# Patient Record
Sex: Male | Born: 1960 | Race: White | Hispanic: No | Marital: Married
Health system: Southern US, Community
[De-identification: ages and names within clinical notes are randomized; demographics above are authoritative.]

## PROBLEM LIST (undated history)

## (undated) DIAGNOSIS — R918 Other nonspecific abnormal finding of lung field: Secondary | ICD-10-CM

## (undated) DIAGNOSIS — J90 Pleural effusion, not elsewhere classified: Secondary | ICD-10-CM

## (undated) DIAGNOSIS — J449 Chronic obstructive pulmonary disease, unspecified: Secondary | ICD-10-CM

## (undated) DIAGNOSIS — J189 Pneumonia, unspecified organism: Secondary | ICD-10-CM

---

## 2003-04-17 ENCOUNTER — Inpatient Hospital Stay (HOSPITAL_COMMUNITY): Admission: AD | Admit: 2003-04-17 | Discharge: 2003-04-20 | Payer: Self-pay | Admitting: Cardiovascular Disease

## 2003-04-18 ENCOUNTER — Encounter (INDEPENDENT_AMBULATORY_CARE_PROVIDER_SITE_OTHER): Payer: Self-pay | Admitting: Cardiology

## 2004-03-18 ENCOUNTER — Encounter: Admission: RE | Admit: 2004-03-18 | Discharge: 2004-03-18 | Payer: Self-pay | Admitting: Cardiovascular Disease

## 2005-04-28 ENCOUNTER — Ambulatory Visit: Payer: Self-pay | Admitting: Pulmonary Disease

## 2005-05-11 ENCOUNTER — Ambulatory Visit: Admission: RE | Admit: 2005-05-11 | Discharge: 2005-05-11 | Payer: Self-pay | Admitting: Pulmonary Disease

## 2005-05-15 ENCOUNTER — Ambulatory Visit: Admission: RE | Admit: 2005-05-15 | Discharge: 2005-05-15 | Payer: Self-pay | Admitting: Pulmonary Disease

## 2005-05-15 ENCOUNTER — Ambulatory Visit: Payer: Self-pay | Admitting: Pulmonary Disease

## 2005-05-29 ENCOUNTER — Ambulatory Visit: Payer: Self-pay | Admitting: Pulmonary Disease

## 2005-06-05 ENCOUNTER — Ambulatory Visit: Payer: Self-pay | Admitting: Pulmonary Disease

## 2005-07-16 ENCOUNTER — Encounter: Admission: RE | Admit: 2005-07-16 | Discharge: 2005-07-16 | Payer: Self-pay | Admitting: Rheumatology

## 2005-07-22 ENCOUNTER — Ambulatory Visit: Admission: RE | Admit: 2005-07-22 | Discharge: 2005-07-22 | Payer: Self-pay | Admitting: Pulmonary Disease

## 2005-07-22 ENCOUNTER — Ambulatory Visit: Payer: Self-pay | Admitting: Pulmonary Disease

## 2005-09-01 ENCOUNTER — Ambulatory Visit: Payer: Self-pay | Admitting: Pulmonary Disease

## 2005-12-01 ENCOUNTER — Ambulatory Visit: Payer: Self-pay | Admitting: Pulmonary Disease

## 2006-04-14 ENCOUNTER — Ambulatory Visit: Payer: Self-pay | Admitting: Pulmonary Disease

## 2006-07-15 ENCOUNTER — Ambulatory Visit: Payer: Self-pay | Admitting: Pulmonary Disease

## 2006-07-17 ENCOUNTER — Ambulatory Visit: Payer: Self-pay | Admitting: Cardiology

## 2006-11-28 DIAGNOSIS — Z8719 Personal history of other diseases of the digestive system: Secondary | ICD-10-CM

## 2006-11-28 DIAGNOSIS — E119 Type 2 diabetes mellitus without complications: Secondary | ICD-10-CM | POA: Insufficient documentation

## 2006-11-28 DIAGNOSIS — I252 Old myocardial infarction: Secondary | ICD-10-CM

## 2006-11-28 DIAGNOSIS — I10 Essential (primary) hypertension: Secondary | ICD-10-CM | POA: Insufficient documentation

## 2006-11-28 DIAGNOSIS — M069 Rheumatoid arthritis, unspecified: Secondary | ICD-10-CM | POA: Insufficient documentation

## 2006-11-28 DIAGNOSIS — I509 Heart failure, unspecified: Secondary | ICD-10-CM | POA: Insufficient documentation

## 2006-11-28 DIAGNOSIS — G4730 Sleep apnea, unspecified: Secondary | ICD-10-CM | POA: Insufficient documentation

## 2006-11-28 DIAGNOSIS — J4489 Other specified chronic obstructive pulmonary disease: Secondary | ICD-10-CM | POA: Insufficient documentation

## 2006-11-28 DIAGNOSIS — G2581 Restless legs syndrome: Secondary | ICD-10-CM | POA: Insufficient documentation

## 2006-11-28 DIAGNOSIS — J449 Chronic obstructive pulmonary disease, unspecified: Secondary | ICD-10-CM

## 2006-11-28 DIAGNOSIS — I2510 Atherosclerotic heart disease of native coronary artery without angina pectoris: Secondary | ICD-10-CM | POA: Insufficient documentation

## 2006-11-28 DIAGNOSIS — E785 Hyperlipidemia, unspecified: Secondary | ICD-10-CM

## 2007-05-05 ENCOUNTER — Ambulatory Visit: Payer: Self-pay | Admitting: Pulmonary Disease

## 2007-05-06 ENCOUNTER — Telehealth: Payer: Self-pay | Admitting: Pulmonary Disease

## 2007-05-18 ENCOUNTER — Ambulatory Visit: Payer: Self-pay | Admitting: Cardiology

## 2007-05-26 ENCOUNTER — Encounter: Payer: Self-pay | Admitting: Cardiology

## 2007-05-26 ENCOUNTER — Ambulatory Visit: Payer: Self-pay | Admitting: Cardiology

## 2007-06-09 ENCOUNTER — Telehealth: Payer: Self-pay | Admitting: Pulmonary Disease

## 2008-05-16 ENCOUNTER — Ambulatory Visit: Payer: Self-pay | Admitting: Cardiology

## 2008-05-16 ENCOUNTER — Encounter: Payer: Self-pay | Admitting: Cardiology

## 2008-05-16 DIAGNOSIS — F17200 Nicotine dependence, unspecified, uncomplicated: Secondary | ICD-10-CM | POA: Insufficient documentation

## 2008-06-05 ENCOUNTER — Telehealth: Payer: Self-pay | Admitting: Cardiology

## 2008-07-05 ENCOUNTER — Encounter: Payer: Self-pay | Admitting: Pulmonary Disease

## 2009-06-04 ENCOUNTER — Ambulatory Visit: Payer: Self-pay | Admitting: Cardiology

## 2010-01-10 ENCOUNTER — Encounter: Payer: Self-pay | Admitting: Pulmonary Disease

## 2010-01-28 ENCOUNTER — Encounter: Payer: Self-pay | Admitting: Pulmonary Disease

## 2010-02-21 ENCOUNTER — Encounter: Payer: Self-pay | Admitting: Pulmonary Disease

## 2010-03-01 ENCOUNTER — Encounter: Payer: Self-pay | Admitting: Pulmonary Disease

## 2010-03-03 ENCOUNTER — Encounter: Payer: Self-pay | Admitting: Pulmonary Disease

## 2010-03-12 NOTE — Assessment & Plan Note (Signed)
Summary: 4-6 month fu-[place] pat lives in [place]   Visit Type:  Follow-up Primary Provider:  Dr. Lorelei Pont in [place], [place]  CC:  CAD.  History of Present Illness: The patient returns for yearly followup. Since I last saw him he has had an unexplained 60 pound weight loss. With that weight loss his sugars are now much better controlled. He otherwise has felt well. He has had no chest pressure, neck or arm discomfort. He has had no shortness of breath, PND or orthopnea. He has had no palpitations, presyncope or syncope.  Preventive Screening-Counseling & Management  Alcohol-Tobacco     Smoking Status: current  Comments: smokes about 3 cigs/day. Started smoking around 50 yrs old  Current Medications (verified): 1)  Methadone Hcl 10 Mg/ml  Conc (Methadone Hcl) .... Take One Pill Three Times Per Day 2)  Percocet 5-325 Mg  Tabs (Oxycodone-Acetaminophen) .... Take One Pill Four Times Per Day As Needed 3)  Coreg 25 Mg  Tabs (Carvedilol) .... Take Half A Pill Twice Daily 4)  Alprazolam 0.5 Mg  Tabs (Alprazolam) .... Take One Pill Twice Daily 5)  K-Lor 20 Meq  Pack (Potassium Chloride) .... Take One Pill Once Daily 6)  Furosemide 40 Mg  Tabs (Furosemide) .... Take One Pill Twice Daily 7)  Bayer Low Strength 81 Mg  Tbec (Aspirin) .... Take One Pill Once Daily 8)  Methotrexate 2.5 Mg  Tabs (Methotrexate Sodium) .... Take Ten Pills Per Week 9)  Qualaquin 324 Mg  Caps (Quinine Sulfate) .... Take Once Daily 10)  Prevacid 30 Mg  Cpdr (Lansoprazole) .... Take One Pill Daily 11)  Albuterol 90 Mcg/act  Aers (Albuterol) .... Take Two Puffs Every Four Hours As Needed 12)  Fibercon 625 Mg  Tabs (Calcium Polycarbophil) .... Take As Needed 13)  Prednisone 5 Mg  Tabs (Prednisone) .... As Needed 14)  Folic Acid 1 Mg  Tabs (Folic Acid) .Marland Kitchen.. 1 By Mouth Daily 15)  Celebrex 200 Mg  Caps (Celecoxib) .Marland Kitchen.. 1 By Mouth Two Times A Day 16)  Parafon Forte Dsc 500 Mg  Tabs (Chlorzoxazone) .Marland Kitchen.. 1 By Mouth Three  Times A Day 17)  Spiriva Handihaler 18 Mcg  Caps (Tiotropium Bromide Monohydrate) .Marland Kitchen.. 1 Daily 18)  Nitroquick 0.4 Mg  Subl (Nitroglycerin) .... As Needed 19)  Flonase 50 Mcg/act  Susp (Fluticasone Propionate) .... 2 Sprays Each Nostril Once Daily As Needed 20)  Altace 2.5 Mg Tabs (Ramipril) .... Take 1 Tablet By Mouth Two Times A Day 21)  Ambien 10 Mg Tabs (Zolpidem Tartrate) .... One By Mouth Daily 22)  Vytorin 10-40 Mg Tabs (Ezetimibe-Simvastatin) .... One By Mouth Daily 23)  Metformin Hcl 500 Mg Tabs (Metformin Hcl) .... Take 1 Tablet By Mouth Once A Day As Needed 24)  Claritin 10 Mg Tabs (Loratadine) .... Take 1 Tablet By Mouth Once A Day As Needed 25)  Stool Softener 100 Mg Caps (Docusate Sodium) .... As Needed  Allergies: No Known Drug Allergies  Comments:  Nurse/Medical Assistant: The patient's medications were reviewed with the patient and were updated in the Medication List. Pt brought a list of medications to office visit.  Cyril Loosen, RN, BSN (June 04, 2009 10:50 AM)  Past History:  Past Medical History: Coronary artery disease (anterior myocardial   infarction with stenting to his LAD with a cypher stent, I do not have the details of his   most recent catheterization which apparently in 2006), mildly reduced   ejection fraction (echo in February 2007 in  [place] with an EF of   50%.), hypertension  since 1999, hyperlipidemia since 1999, rheumatoid arthritis, diabetes   mellitus since 2002, restless leg syndrome, sleep apnea on CPAP  Past Surgical History: Reviewed history from 05/11/2008 and no changes required. Back surgery, hemorrhoids.   Review of Systems       As stated in the HPI and negative for all other systems.   Vital Signs:  Patient profile:   50 year old male Height:      66 inches Weight:      241.75 pounds BMI:     39.16 Pulse rate:   62 / minute BP sitting:   104 / 67  (left arm) Cuff size:   regular  Vitals Entered By: Cyril Loosen, RN, BSN (June 04, 2009 10:48 AM)  Nutrition Counseling: Patient's BMI is greater than 25 and therefore counseled on weight management options. CC: CAD Comments follow up visit   Physical Exam  General:  Well developed, well nourished, in no acute distress. Head:  normocephalic and atraumatic Eyes:  PERRLA/EOM intact; conjunctiva and lids normal. Mouth:  Teeth, gums and palate normal. Oral mucosa normal. Neck:  Neck supple, no JVD. No masses, thyromegaly or abnormal cervical nodes. Heart:  Non-displaced PMI, chest non-tender; regular rate and rhythm, S1, S2 soft systolic murmur heard only at the apex and nonradiating,  no rubs or gallops. Carotid upstroke normal, no bruit. Normal abdominal aortic size, no bruits. Femorals normal pulses, no bruits. Pedals normal pulses. trace edema, no varicosities. Abdomen:  Bowel sounds positive; abdomen soft and non-tender without masses, organomegaly, or hernias noted. No hepatosplenomegaly, obese Msk:  Back normal, normal gait. Muscle strength and tone normal. Extremities:  No clubbing or cyanosis. Neurologic:  Alert and oriented x 3. Psych:  Normal affect.   EKG  Procedure date:  06/04/2009  Findings:      sinus rhythm, rate 63, axis within normal limits, intervals within normal limits, old anteroseptal infarct, no acute ST-T wave changes  Impression & Recommendations:  Problem # 1:  CAD (ICD-414.00) He has no ongoing symptoms. No further cardiovascular testing is suggested. He will continue with risk reduction. Orders: EKG w/ Interpretation (93000)  Problem # 2:  HYPERTENSION (ICD-401.9) His blood pressure is well controlled. He will continue the meds as listed.  Problem # 3:  TOBACCO ABUSE (ICD-305.1) He understands the need to stop smoking. He has been counseled about this. Hopefully he will be able to do this in the future.  Patient Instructions: 1)  Your physician recommends that you continue on your current medications as  directed. Please refer to the Current Medication list given to you today. 2)  Your physician wants you to follow-up in: 1year. You will receive a reminder letter in the mail about two months in advance. If you don't receive a letter, please call our office to schedule the follow-up appointment.

## 2010-03-12 NOTE — Medication Information (Signed)
Summary: RX Folder/ MEDICATION LIST  RX Folder/ MEDICATION LIST   Imported By: Dorise Hiss 06/05/2009 11:26:59  _____________________________________________________________________  External Attachment:    Type:   Image     Comment:   External Document

## 2010-03-13 ENCOUNTER — Other Ambulatory Visit: Payer: Self-pay | Admitting: Pulmonary Disease

## 2010-03-13 ENCOUNTER — Encounter: Payer: Self-pay | Admitting: Pulmonary Disease

## 2010-03-13 ENCOUNTER — Ambulatory Visit (INDEPENDENT_AMBULATORY_CARE_PROVIDER_SITE_OTHER): Payer: Managed Care, Other (non HMO) | Admitting: Pulmonary Disease

## 2010-03-13 DIAGNOSIS — J4489 Other specified chronic obstructive pulmonary disease: Secondary | ICD-10-CM

## 2010-03-13 DIAGNOSIS — M069 Rheumatoid arthritis, unspecified: Secondary | ICD-10-CM

## 2010-03-13 DIAGNOSIS — J90 Pleural effusion, not elsewhere classified: Secondary | ICD-10-CM | POA: Insufficient documentation

## 2010-03-13 DIAGNOSIS — F17200 Nicotine dependence, unspecified, uncomplicated: Secondary | ICD-10-CM

## 2010-03-13 DIAGNOSIS — R918 Other nonspecific abnormal finding of lung field: Secondary | ICD-10-CM

## 2010-03-13 DIAGNOSIS — J449 Chronic obstructive pulmonary disease, unspecified: Secondary | ICD-10-CM

## 2010-03-13 DIAGNOSIS — R222 Localized swelling, mass and lump, trunk: Secondary | ICD-10-CM

## 2010-03-13 DIAGNOSIS — J9610 Chronic respiratory failure, unspecified whether with hypoxia or hypercapnia: Secondary | ICD-10-CM | POA: Insufficient documentation

## 2010-03-14 ENCOUNTER — Telehealth (INDEPENDENT_AMBULATORY_CARE_PROVIDER_SITE_OTHER): Payer: Self-pay | Admitting: *Deleted

## 2010-03-20 NOTE — Progress Notes (Signed)
  Phone Note Other Incoming   Request: Send information Summary of Call: Records received from [facility] [place]. 33 pages forwarded to Dr. Craige Cotta for review.

## 2010-03-20 NOTE — Assessment & Plan Note (Addendum)
Summary: OV CT SHOWS LUNG MASS//SH   Vital Signs:  Patient profile:   50 year old male Height:      66 inches Weight:      228.13 pounds BMI:     36.95 O2 Sat:      90 % on 3 L/min Temp:     97.6 degrees F oral Pulse rate:   71 / minute BP sitting:   92 / 54  (left arm) Cuff size:   large  Vitals Entered By: Carver Fila (March 13, 2010 4:00 PM)  O2 Flow:  3 L/min CC: pt states he is here bc his ct showed a mass. Pt states his breathing is not doing well, having some right sided pain and into the ribs, dry cough. Pt states he smokes 3-4 cigs a day. Pt states [facility] is telling him he has lung cancer and is spreading to his lymph nodes.  Comments meds and allergies updated Phone number updated Carver Fila  March 13, 2010 4:01 PM    Copy to:  Rollene Rotunda, Lorelei Pont Primary Provider/Referring Provider:  Dr. Lorelei Pont in [place], [place]  CC:  pt states he is here bc his ct showed a mass. Pt states his breathing is not doing well, having some right sided pain and into the ribs, and dry cough. Pt states he smokes 3-4 cigs a day. Pt states [facility] is telling him he has lung cancer and is spreading to his lymph nodes. .  History of Present Illness: 50 yo male with lung mass.  I last saw [name] in 2009.  At that time I was seeing him for severe COPD, chronic hypoxemic respiratory failure, and sleep apnea.  Since that time he has lost about 100 lbs.  He developed worsening chest pain and shortness of breath in December 2011.  He was found to have fluid in his lungs, and concern was for pneumonia.  He was treated with antibiotics, but did not improve.  He was hospitalized in [place].  He had right thoracentesis done, but he was told results were inconclusive.  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  He still gets winded easily.  He has occasional cough.  He denies  sputum, hemoptysis, or fever.  He gets occasional wheezing.  He uses albuterol 2 to 3 times per day.  He continues to smoke 3 to 4 cigarettes per day.  He uses 3 liters oxygen 24/7.  He was not able to tolerate CPAP or BPAP.   CT chest from 03/01/10: b/l effusions Rt > Lt with RLL consildation, spiculated mass in LUL measuring 3cm with multiple smaller nodules b/l, and massive mediastinal adenopathy.  Current Medications (verified): 1)  Methadone Hcl 10 Mg/ml  Conc (Methadone Hcl) .... Take One Pill Four Times A Day 2)  Oxycodone Hcl 20 Mg Tabs (Oxycodone Hcl) .... 4 Tablets A Day 3)  Coreg 25 Mg  Tabs (Carvedilol) .Marland Kitchen.. 1 Two Times A Day 4)  Alprazolam 0.5 Mg  Tabs (Alprazolam) .... Take One Pill Twice Daily 5)  K-Lor 20 Meq  Pack (Potassium Chloride) .... Take One Pill Once Daily 6)  Furosemide 40 Mg  Tabs (Furosemide) .... Take One Pill Twice Daily 7)  Bayer Low Strength 81 Mg  Tbec (Aspirin) .... Take One Pill Once Daily 8)  Methotrexate 2.5 Mg  Tabs (Methotrexate Sodium) .... Take Ten Pills Per Week 9)  Qualaquin 324 Mg  Caps (Quinine Sulfate) .... Takes 300 Mg A Day. Orders From [place] 10)  Prevacid 30 Mg  Cpdr (Lansoprazole) .... Take One Pill Daily 11)  Albuterol 90 Mcg/act  Aers (Albuterol) .... Take Two Puffs Every Four Hours As Needed 12)  Fibercon 625 Mg  Tabs (Calcium Polycarbophil) .... Take As Needed 13)  Prednisone 5 Mg  Tabs (Prednisone) .... As Needed 14)  Folic Acid 1 Mg  Tabs (Folic Acid) .Marland Kitchen.. 1 By Mouth Daily 15)  Celebrex 200 Mg  Caps (Celecoxib) .Marland Kitchen.. 1 By Mouth Two Times A Day 16)  Parafon Forte Dsc 500 Mg  Tabs (Chlorzoxazone) .Marland Kitchen.. 1 By Mouth Three Times A Day 17)  Spiriva Handihaler 18 Mcg  Caps (Tiotropium Bromide Monohydrate) .Marland Kitchen.. 1 Daily 18)  Nitroquick 0.4 Mg  Subl (Nitroglycerin) .... As Needed 19)  Flonase 50 Mcg/act  Susp (Fluticasone Propionate) .... 2 Sprays Each Nostril Once Daily As Needed 20)  Altace 2.5 Mg Tabs (Ramipril) .... Take 1 Tablet By Mouth Two  Times A Day 21)  Ambien 10 Mg Tabs (Zolpidem Tartrate) .... One By Mouth Daily 22)  Vytorin 10-40 Mg Tabs (Ezetimibe-Simvastatin) .... One By Mouth Daily 23)  Metformin Hcl 500 Mg Tabs (Metformin Hcl) .... Take 1 Tablet By Mouth Once A Day As Needed 24)  Claritin 10 Mg Tabs (Loratadine) .... Take 1 Tablet By Mouth Once A Day As Needed 25)  Stool Softener 100 Mg Caps (Docusate Sodium) .... As Needed 26)  Benzonatate 100 Mg Caps (Benzonatate) .... 3 Times A Day As Needed 27)  Voltaren 1 % Gel (Diclofenac Sodium) .... Apply 2-4 Times A Day  Allergies (verified): No Known Drug Allergies  Past History:  Past Medical History: Coronary artery disease (anterior myocardial infarction with stenting to his LAD with a cypher stent, his most recent catheterization which apparently in 2006), mildly reduced ejection fraction (echo in February 2007 in [place] with an EF of 50%.) Hypertension  since 1999 Hyperlipidemia since 1999 Rheumatoid arthritis Diabetes mellitus since 2002 Restless leg syndrome Obstructive sleep apnea, intolerant of CPAP Severe COPD Chronic hypoxemic respiratory failure      - 3 liters oxygen 24/7  Family History: Reviewed history from 05/11/2008 and no changes required. Father - MI age 29  Social History: Reviewed history from 05/11/2008 and no changes required. The patient is disabled.  He is married.  He has 3 children.  He has smoked for 30 years, and currently smokes 3 to 4 cigarettes per day.     Physical Exam  General:  decreased weight since last visit.  using supplemental oxygen Eyes:  PERRLA and EOMI, wears glasses Nose:  clear nasal discharge.   Mouth:  Teeth, gums and palate normal. Oral mucosa normal. Neck:  no JVD.   Lungs:  diminished breath sounds, no wheeze or rales Heart:  regular rate and rhythm, S1, S2 without murmurs, rubs, gallops, or clicks Abdomen:  soft, nontender, no masses, normal bowel sounds Extremities:  trace leg  edema Neurologic:  normal CN II-XII and strength normal.   Cervical Nodes:  no significant adenopathy Axillary Nodes:  no significant adenopathy Psych:  anxious.     Impression & Recommendations:  Problem # 1:  MASS, LUNG (ICD-786.6) Radiographic findings as detailed above.  Main concern is that he has primary lung malignancy.  [facility]  Will arrange for PET scan.  Based on these will determine best approach for tissue sampling.  Explained to him that he  would not be a surgical candidate, and that likely options would be chemotherapy +/- radiation therapy.  Explained to him that if he has lung cancer, then it is likely advanced stage.  Problem # 2:  COPD (ICD-496) Continue spiriva and as needed albuterol.  Problem # 3:  EFFUSION, PLEURAL (ICD-511.9) Will get records from [facility] about fluid analysis.  Problem # 4:  TOBACCO ABUSE (ICD-305.1) Encouraged him to remain tobacco free.  Problem # 5:  SLEEP APNEA (ICD-780.57) He was not able to tolerate CPAP/BPAP.    Problem # 6:  CHRONIC RESPIRATORY FAILURE (ICD-518.83) He is to continue supplemental oxygen.  Problem # 7:  ARTHRITIS, RHEUMATOID (ICD-714.0) He is on MTX, and prednisone.  Medications Added to Medication List This Visit: 1)  Coreg 25 Mg Tabs (Carvedilol) .Marland Kitchen.. 1 two times a day 2)  Methadone Hcl 10 Mg/ml Conc (Methadone hcl) .... Take one pill four times a day 3)  Oxycodone Hcl 20 Mg Tabs (Oxycodone hcl) .... 4 tablets a day 4)  Qualaquin 324 Mg Caps (Quinine sulfate) .... Takes 300 mg a day. orders from [place] 5)  Benzonatate 100 Mg Caps (Benzonatate) .... 3 times a day as needed 6)  Voltaren 1 % Gel (Diclofenac sodium) .... Apply 2-4 times a day  Complete Medication List: 1)  Vytorin 10-40 Mg Tabs (Ezetimibe-simvastatin) .... One by mouth daily 2)  Metformin Hcl 500 Mg Tabs (Metformin hcl) .... Take 1 tablet by mouth once a day as needed 3)  Altace 2.5 Mg Tabs  (Ramipril) .... Take 1 tablet by mouth two times a day 4)  Coreg 25 Mg Tabs (Carvedilol) .Marland Kitchen.. 1 two times a day 5)  Furosemide 40 Mg Tabs (Furosemide) .... Take one pill twice daily 6)  K-lor 20 Meq Pack (Potassium chloride) .... Take one pill once daily 7)  Bayer Low Strength 81 Mg Tbec (Aspirin) .... Take one pill once daily 8)  Alprazolam 0.5 Mg Tabs (Alprazolam) .... Take one pill twice daily 9)  Ambien 10 Mg Tabs (Zolpidem tartrate) .... One by mouth daily 10)  Methadone Hcl 10 Mg/ml Conc (Methadone hcl) .... Take one pill four times a day 11)  Oxycodone Hcl 20 Mg Tabs (Oxycodone hcl) .... 4 tablets a day 12)  Celebrex 200 Mg Caps (Celecoxib) .Marland Kitchen.. 1 by mouth two times a day 13)  Methotrexate 2.5 Mg Tabs (Methotrexate sodium) .... Take ten pills per week 14)  Folic Acid 1 Mg Tabs (Folic acid) .Marland Kitchen.. 1 by mouth daily 15)  Prednisone 5 Mg Tabs (Prednisone) .... As needed 16)  Qualaquin 324 Mg Caps (Quinine sulfate) .... Takes 300 mg a day. orders from [place] 17)  Prevacid 30 Mg Cpdr (Lansoprazole) .... Take one pill daily 18)  Spiriva Handihaler 18 Mcg Caps (Tiotropium bromide monohydrate) .Marland Kitchen.. 1 daily 19)  Albuterol 90 Mcg/act Aers (Albuterol) .... Take two puffs every four hours as needed 20)  Flonase 50 Mcg/act Susp (Fluticasone propionate) .... 2 sprays each nostril once daily as needed 21)  Claritin 10 Mg Tabs (Loratadine) .... Take 1 tablet by mouth once a day as needed 22)  Fibercon 625 Mg Tabs (Calcium polycarbophil) .... Take as needed 23)  Parafon Forte Dsc 500 Mg Tabs (Chlorzoxazone) .Marland Kitchen.. 1 by mouth three times a day 24)  Nitroquick 0.4 Mg Subl (Nitroglycerin) .... As needed 25)  Stool Softener 100 Mg Caps (Docusate sodium) .... As needed 26)  Benzonatate 100 Mg Caps (Benzonatate) .... 3 times a day as needed 27)  Voltaren 1 %  Gel (Diclofenac sodium) .... Apply 2-4 times a day  Other Orders: Consultation Level IV (16109) Radiology Referral (Radiology)  Patient  Instructions: 1)  Will arrange for PET scan 2)  Follow up in 2 weeks   Orders Added: 1)  Consultation Level IV [60454] 2)  Radiology Referral [Radiology]   Immunization History:  Influenza Immunization History:    Influenza:  historical (11/10/2009)  Pneumovax Immunization History:    Pneumovax:  historical (11/10/2008)   Immunization History:  Influenza Immunization History:    Influenza:  Historical (11/10/2009)  Pneumovax Immunization History:    Pneumovax:  Historical (11/10/2008)

## 2010-03-21 ENCOUNTER — Encounter (HOSPITAL_COMMUNITY)
Admission: RE | Admit: 2010-03-21 | Discharge: 2010-03-21 | Disposition: A | Discharge status: Home / Self Care | Payer: Managed Care, Other (non HMO) | Source: Ambulatory Visit | Attending: Pulmonary Disease | Admitting: Pulmonary Disease

## 2010-03-21 ENCOUNTER — Encounter (HOSPITAL_COMMUNITY): Payer: Self-pay

## 2010-03-21 DIAGNOSIS — R222 Localized swelling, mass and lump, trunk: Secondary | ICD-10-CM | POA: Insufficient documentation

## 2010-03-21 DIAGNOSIS — R599 Enlarged lymph nodes, unspecified: Secondary | ICD-10-CM | POA: Insufficient documentation

## 2010-03-21 DIAGNOSIS — R918 Other nonspecific abnormal finding of lung field: Secondary | ICD-10-CM

## 2010-03-21 DIAGNOSIS — C3490 Malignant neoplasm of unspecified part of unspecified bronchus or lung: Secondary | ICD-10-CM | POA: Insufficient documentation

## 2010-03-21 HISTORY — DX: Pleural effusion, not elsewhere classified: J90

## 2010-03-21 HISTORY — DX: Chronic obstructive pulmonary disease, unspecified: J44.9

## 2010-03-21 HISTORY — DX: Pneumonia, unspecified organism: J18.9

## 2010-03-21 HISTORY — DX: Other nonspecific abnormal finding of lung field: R91.8

## 2010-03-21 MED ORDER — FLUDEOXYGLUCOSE F - 18 (FDG) INJECTION: 15.5000 | Freq: Once | INTRAVENOUS | Status: AC | PRN

## 2010-03-22 ENCOUNTER — Other Ambulatory Visit: Payer: Self-pay | Admitting: Pulmonary Disease

## 2010-03-22 ENCOUNTER — Telehealth (INDEPENDENT_AMBULATORY_CARE_PROVIDER_SITE_OTHER): Payer: Self-pay | Admitting: *Deleted

## 2010-03-22 DIAGNOSIS — C3490 Malignant neoplasm of unspecified part of unspecified bronchus or lung: Secondary | ICD-10-CM

## 2010-03-26 ENCOUNTER — Telehealth (INDEPENDENT_AMBULATORY_CARE_PROVIDER_SITE_OTHER): Payer: Self-pay | Admitting: *Deleted

## 2010-03-26 ENCOUNTER — Other Ambulatory Visit: Payer: Self-pay | Admitting: Pulmonary Disease

## 2010-03-26 ENCOUNTER — Telehealth: Payer: Self-pay | Admitting: Pulmonary Disease

## 2010-03-26 DIAGNOSIS — R918 Other nonspecific abnormal finding of lung field: Secondary | ICD-10-CM

## 2010-03-27 ENCOUNTER — Other Ambulatory Visit (HOSPITAL_COMMUNITY): Payer: Managed Care, Other (non HMO)

## 2010-03-27 ENCOUNTER — Other Ambulatory Visit: Payer: Self-pay | Admitting: Interventional Radiology

## 2010-03-27 ENCOUNTER — Ambulatory Visit: Payer: Managed Care, Other (non HMO) | Admitting: Pulmonary Disease

## 2010-03-27 ENCOUNTER — Other Ambulatory Visit (HOSPITAL_COMMUNITY): Payer: Medicare Other

## 2010-03-27 ENCOUNTER — Ambulatory Visit (HOSPITAL_COMMUNITY)
Admission: RE | Admit: 2010-03-27 | Discharge: 2010-03-27 | Disposition: A | Discharge status: Home / Self Care | Payer: Managed Care, Other (non HMO) | Source: Ambulatory Visit | Attending: Pulmonary Disease | Admitting: Pulmonary Disease

## 2010-03-27 DIAGNOSIS — R599 Enlarged lymph nodes, unspecified: Secondary | ICD-10-CM | POA: Insufficient documentation

## 2010-03-27 DIAGNOSIS — R918 Other nonspecific abnormal finding of lung field: Secondary | ICD-10-CM

## 2010-03-27 DIAGNOSIS — C7951 Secondary malignant neoplasm of bone: Secondary | ICD-10-CM | POA: Insufficient documentation

## 2010-03-27 DIAGNOSIS — C3490 Malignant neoplasm of unspecified part of unspecified bronchus or lung: Secondary | ICD-10-CM | POA: Insufficient documentation

## 2010-03-27 LAB — CBC
HCT: 35.6 % — ABNORMAL LOW (ref 39.0–52.0)
MCHC: 31.7 g/dL (ref 30.0–36.0)
MCV: 82 fL (ref 78.0–100.0)
RDW: 16.9 % — ABNORMAL HIGH (ref 11.5–15.5)

## 2010-03-27 LAB — GLUCOSE, CAPILLARY: Glucose-Capillary: 146 mg/dL — ABNORMAL HIGH (ref 70–99)

## 2010-03-27 LAB — PROTIME-INR: INR: 1.02 (ref 0.00–1.49)

## 2010-03-28 ENCOUNTER — Telehealth: Payer: Self-pay | Admitting: Pulmonary Disease

## 2010-03-28 DIAGNOSIS — C3490 Malignant neoplasm of unspecified part of unspecified bronchus or lung: Secondary | ICD-10-CM | POA: Insufficient documentation

## 2010-03-28 NOTE — Progress Notes (Signed)
Summary: Biopsy<<<FNA sch for 1/15  Phone Note Other Incoming Call back at [phone]   Caller: Victorino Dike at radiology scheduling Summary of Call: Victorino Dike wants to talk to either Crystal or Lawson Fiscal regarding scheduling patient's fine needle biopsy on 03/27/10. Initial call taken by: Leonette Monarch,  March 22, 2010 2:07 PM  Follow-up for Phone Call        FNA sch for 2/15 and per [facility] in [place], pt is aware and date works fine for him. Will forward to [place] so she can document this on the order. Follow-up by: Michel Bickers CMA,  March 22, 2010 3:02 PM  Additional Follow-up for Phone Call Additional follow up Details #1::        documented in order that FNA scheduled 03/27/10 Additional Follow-up by: Oneita Jolly,  March 22, 2010 3:27 PM

## 2010-04-01 ENCOUNTER — Telehealth (INDEPENDENT_AMBULATORY_CARE_PROVIDER_SITE_OTHER): Payer: Self-pay | Admitting: *Deleted

## 2010-04-03 NOTE — Letter (Signed)
Summary: [facility] [address]  [facility]   Imported By: Lester [place] 03/28/2010 09:58:49  _____________________________________________________________________  External Attachment:    Type:   Image     Comment:   External Document

## 2010-04-03 NOTE — Op Note (Signed)
Summary: R pleural effusion/[facility]  R pleural effusion/[facility]   Imported By: Lester [place] 03/28/2010 09:55:48  _____________________________________________________________________  External Attachment:    Type:   Image     Comment:   External Document

## 2010-04-03 NOTE — Progress Notes (Signed)
Summary: procedures pending tomorrow- pt called x 2  Phone Note Call from Patient Call back at [facility] Phone [phone]   Caller: Patient Call For: sood Summary of Call: pt says he got a call re: biopsy that's scheduled for tomorrow at [facility]. pt says "they also want to draw fluid from his lungs". pt says this was already done at [facility]. says the notes were sent to dr Merton Border sleeper and they were to fax info to dr Smiley Houseman. dr sleeper's office # is [phone].  contact # is [phone]. pt wants call back asap to see "where/ when" he needs to have things done (and what procedures are to be done).  Initial call taken by: Tivis Ringer, CNA,  March 26, 2010 10:21 AM  Follow-up for Phone Call        pt called back requests to speak to dr Craige Cotta or nurse asap this am. Tivis Ringer, CNA  March 26, 2010 11:31 AM  Spoke with pt .  Dr Craige Cotta has spoken with pt and he is going to call him back. Abigail Miyamoto RN  March 26, 2010 11:56 AM

## 2010-04-03 NOTE — Progress Notes (Signed)
Summary: biopsy order  Phone Note From Other Clinic   Caller: Victorino Dike from [facility] Call For: Dr. Craige Cotta Summary of Call: [phone] Victorino Dike from [facility] scheduling called and stated that the radiologist here has reviewed order for biopsy and has recommended doing a thoracentesis. When [place] contacted pt he stated "that he has already had this done at another hospital and that you were aware of this and should have the report from [facility]".  Pt has been scheduled for a thoracentesis tomorrow 03/27/10 at 2:00 [facility].  Pt has stated that he is not going to have a thoracentesis he wants to have the biopsy. Please advise. Thanks, Initial call taken by: Alfonso Ramus,  March 26, 2010 10:23 AM  Follow-up for Phone Call        Plan d/w pt and IR.  He is to have bx likely from Rt chest wall lesion today at 1pm. Follow-up by: Coralyn Helling MD,  March 27, 2010 10:54 AM

## 2010-04-03 NOTE — Progress Notes (Signed)
Summary: Call [name] with results  ---- Converted from flag ---- ---- 03/28/2010 4:03 PM, Coralyn Helling MD wrote: Please schedule ROV to review biopsy results.  Okay to over book for 02/17.  Please check if [name] would prefer I call him with results instead since he has a long distance drive to come here.  thanks. ------------------------------  Phone Note Outgoing Call   Call placed by: Renold Genta RCP, LPN,  March 28, 2010 4:24 PM Summary of Call: Spoke with [name] and he is ok for Dr. Craige Cotta to call him with results , he does have an appt on Feb 29th but doesnt want to wait. Renold Genta RCP, LPN  March 28, 2010 4:25 PM   Follow-up for Phone Call        Spoke with patient over the phone.  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  He would like to have evaluation with oncologist in [place] first to review his options and then decide what he would like to do.  Will also reschedule his ROV for 2 to 3 months Follow-up by: Coralyn Helling MD,  March 28, 2010 4:51 PM  New Problems: ADENOCARCIMONA, LUNG, NONSMALL CELL (ICD-162.9)   New Problems: ADENOCARCIMONA, LUNG, NONSMALL CELL (ICD-162.9)  Appended Document: Call [name] with results Discussed results and plan for oncology referral in [place] with Dr. Leary Roca.

## 2010-04-04 ENCOUNTER — Telehealth (INDEPENDENT_AMBULATORY_CARE_PROVIDER_SITE_OTHER): Payer: Self-pay | Admitting: *Deleted

## 2010-04-05 ENCOUNTER — Ambulatory Visit: Payer: Managed Care, Other (non HMO) | Admitting: Pulmonary Disease

## 2010-04-08 ENCOUNTER — Ambulatory Visit
Admission: RE | Admit: 2010-04-08 | Discharge: 2010-04-08 | Disposition: A | Discharge status: Home / Self Care | Payer: Managed Care, Other (non HMO) | Source: Ambulatory Visit | Attending: Internal Medicine | Admitting: Internal Medicine

## 2010-04-08 ENCOUNTER — Other Ambulatory Visit: Payer: Self-pay | Admitting: Internal Medicine

## 2010-04-08 ENCOUNTER — Encounter (HOSPITAL_BASED_OUTPATIENT_CLINIC_OR_DEPARTMENT_OTHER): Payer: Managed Care, Other (non HMO) | Admitting: Internal Medicine

## 2010-04-08 ENCOUNTER — Encounter: Payer: Managed Care, Other (non HMO) | Admitting: Internal Medicine

## 2010-04-08 DIAGNOSIS — Z85118 Personal history of other malignant neoplasm of bronchus and lung: Secondary | ICD-10-CM

## 2010-04-08 DIAGNOSIS — C3490 Malignant neoplasm of unspecified part of unspecified bronchus or lung: Secondary | ICD-10-CM

## 2010-04-08 DIAGNOSIS — R918 Other nonspecific abnormal finding of lung field: Secondary | ICD-10-CM

## 2010-04-08 LAB — CBC WITH DIFFERENTIAL/PLATELET
Basophils Absolute: 0 10*3/uL (ref 0.0–0.1)
Eosinophils Absolute: 0.2 10*3/uL (ref 0.0–0.5)
HCT: 37.6 % — ABNORMAL LOW (ref 38.4–49.9)
HGB: 11.8 g/dL — ABNORMAL LOW (ref 13.0–17.1)
MONO#: 0.8 10*3/uL (ref 0.1–0.9)
NEUT%: 85.8 % — ABNORMAL HIGH (ref 39.0–75.0)
WBC: 11.2 10*3/uL — ABNORMAL HIGH (ref 4.0–10.3)
lymph#: 0.6 10*3/uL — ABNORMAL LOW (ref 0.9–3.3)

## 2010-04-08 LAB — COMPREHENSIVE METABOLIC PANEL
BUN: 10 mg/dL (ref 6–23)
CO2: 31 mEq/L (ref 19–32)
Calcium: 9.2 mg/dL (ref 8.4–10.5)
Chloride: 96 mEq/L (ref 96–112)
Creatinine, Ser: 0.53 mg/dL (ref 0.40–1.50)

## 2010-04-09 ENCOUNTER — Encounter: Payer: Self-pay | Admitting: Pulmonary Disease

## 2010-04-09 ENCOUNTER — Encounter (INDEPENDENT_AMBULATORY_CARE_PROVIDER_SITE_OTHER): Payer: Managed Care, Other (non HMO) | Admitting: Thoracic Surgery

## 2010-04-09 DIAGNOSIS — C3430 Malignant neoplasm of lower lobe, unspecified bronchus or lung: Secondary | ICD-10-CM

## 2010-04-09 NOTE — Progress Notes (Signed)
Summary: stiil has not heard from cancer center  Phone Note Call from Patient   Caller: Patient Call For: sood Summary of Call: patient phoned stated that he still has not heard from the cancer center and he stated that if they can not see him he would like to be referred to someone that can. He stated that he has been informed that he has possible stage 4 lung cancer and he would like to see someone. he can be reached at [phone] Initial call taken by: Vedia Coffer,  April 04, 2010 10:24 AM  Follow-up for Phone Call        pt called by [email or web address]  today and informed of appt 04/08/10@1 :30pm with dr Gwenyth Bouillon  Follow-up by: Oneita Jolly,  April 04, 2010 12:04 PM

## 2010-04-09 NOTE — Progress Notes (Signed)
Summary: referral to oncologist  Phone Note Call from Patient Call back at Home Phone [phone]   Caller: Patient Call For: SOOD Summary of Call: patient phoned stated that he spoke to Dr Craige Cotta Thursday afternoon and Dr Craige Cotta told him that we were going to refer him to an oncolongist and he has not heard back yet and wanted to check on the referral. he can be reached at [phone]. Initial call taken by: Vedia Coffer,  April 01, 2010 2:50 PM  Follow-up for Phone Call        VS sent order to [facility] last Thursday 03-28-2010 for oncology referral.  pt states he still hasn't been set up for an appt with Oncology.  Will forward message to [facility] to address.  Aundra Millet Reynolds LPN  April 01, 2010 5:00 PM   Additional Follow-up for Phone Call Additional follow up Details #1::        spoke to [email or web address]  and she states dana is still working on this appt i will email danna and the pt is aware of this  Additional Follow-up by: Oneita Jolly,  April 02, 2010 2:36 PM

## 2010-04-10 ENCOUNTER — Ambulatory Visit: Payer: Managed Care, Other (non HMO) | Admitting: Pulmonary Disease

## 2010-04-10 NOTE — Letter (Signed)
April 09, 2010  Velora Heckler. Arbutus Ped, MD [address] [place], [place] [address]  Re:  [name], [name]                  DOB:  [birth year]  Dear Arbutus Ped,  I saw the patient back in the office today.  This 50 year old patient unfortunately has had terrible medical history for the last 15 years, and he is disabled.  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  His brain scan, however, was negative.  A biopsy had a 4.3-cm mass in the right lower lobe with multiple pleural mets, abdominal mets, and involvement of the left recurrent nerve, relatively the non-small-cell lung cancer.  He is being considered for radiation and chemotherapy.  When he had a thoracentesis done in [place], they drew 2 liters, but the biopsies were somewhat inconclusive.  He also has had a previous myocardial infarction with stent.  He has hypertension, hyperlipidemia, rheumatoid arthritis, diabetes mellitus, restless legs syndrome, obstructive sleep apnea, but he cannot use CPAP, severe COPD, chronic hypoxia, he is on oxygen.  He is being referred for possible rebiopsy to gain more tissue for genetic testing as well as confirm the type of biopsy as well as a possible Pleurx catheter and as well as a Port-A-Cath.  His medications include; Coreg 25 mg a day, methadone 10 mg a day, oxycodone, Qualaquin, benzoate, Voltaren, Vytorin, metformin, Altace, Lasix, potassium, alprazolam, Ambien, Celebrex, prednisone p.r.n., Spiriva.  He has no allergies and had previous back surgery.  His family history is positive for diabetes and hypertension.  SOCIAL HISTORY:  He is married, has 3 children.  He is disabled.  He still continues to smoke a half pack of cigarettes, does not drink alcohol on a regular basis.  REVIEW OF SYSTEMS:  GENERAL:  He is 210 pounds, he is 5 feet 4 inches. His weight has been stable. CARDIAC:  He has had a previous myocardial infarction with the stent  as mentioned.  He has shortness of breath. PULMONARY:  He has had some hemoptysis.  He was on Plavix, but this has been stopped. GI:  He has reflux and constipation. GU:  No kidney disease, dysuria, or frequent urination. VASCULAR:  Pain in his legs with walking.  No DVT or TIAs. NEUROLOGICAL:  No dizziness, headaches, blackouts, seizures. MUSCULOSKELETAL:  Arthritis and joint pain. PSYCHIATRIC:  Nervousness. EYE AND ENT:  No changes in eyesight or hearing. HEMATOLOGICAL:  No problems with bleeding, clotting disorders, or anemia.  On physical exam, he is an ill-appearing Caucasian male, in no acute distress.  His blood pressure is 102/60, pulse 83, respirations 18, sats were 88%.  Head, eyes, ears, nose, and throat were unremarkable.  Neck is supple.  There is a questionable right scalene node.  Chest has decreased breath sounds on the right, some wheezes on the left.  Heart: Regular sinus rhythm.  No murmurs.  Abdomen is soft.  There is no hepatosplenomegaly.  Extremities:  Pulses are 2+.  There is no clubbing or edema.  Neurologically, he is oriented x3.  Sensory and motor intact. Cranial nerves intact.  He does have some hoarseness.  Today, unfortunately, the patient has a stage IV cancer.  I discussed the situation with you and planned next Tuesday to insert Port-A-Cath, Pleurx catheter as well as do a scalene node biopsy.  He is somewhat reluctant to have anything done because of his poor prognosis.  I did try to explain  to him that even though there is cost of his genetic test, if his EGFR were up, one possibility that he would markedly improve his prognosis.  I will discuss this with you in more detail.  Ines Bloomer, M.D. Electronically Signed  DPB/MEDQ  D:  04/09/2010  T:  04/09/2010  Job:  [id number]  cc:   Coralyn Helling, MD

## 2010-04-15 ENCOUNTER — Ambulatory Visit: Payer: Managed Care, Other (non HMO) | Attending: Radiation Oncology | Admitting: Radiation Oncology

## 2010-04-15 DIAGNOSIS — C7951 Secondary malignant neoplasm of bone: Secondary | ICD-10-CM | POA: Insufficient documentation

## 2010-04-15 DIAGNOSIS — Z79899 Other long term (current) drug therapy: Secondary | ICD-10-CM | POA: Insufficient documentation

## 2010-04-15 DIAGNOSIS — C7952 Secondary malignant neoplasm of bone marrow: Secondary | ICD-10-CM | POA: Insufficient documentation

## 2010-04-15 DIAGNOSIS — C3410 Malignant neoplasm of upper lobe, unspecified bronchus or lung: Secondary | ICD-10-CM | POA: Insufficient documentation

## 2010-04-15 DIAGNOSIS — M069 Rheumatoid arthritis, unspecified: Secondary | ICD-10-CM | POA: Insufficient documentation

## 2010-04-17 ENCOUNTER — Ambulatory Visit (HOSPITAL_COMMUNITY)
Admission: RE | Admit: 2010-04-17 | Payer: Managed Care, Other (non HMO) | Source: Ambulatory Visit | Admitting: Thoracic Surgery

## 2010-04-23 NOTE — Letter (Signed)
Summary: [facility]  [facility]   Imported By: Sherian Rein 04/19/2010 14:56:13  _____________________________________________________________________  External Attachment:    Type:   Image     Comment:   External Document

## 2010-05-20 NOTE — Letter (Signed)
April 16, 2010  Mohamed K. Arbutus Ped, MD [address] [place], [place] [address]  Re:  [name], HEFFINGTON                  DOB:  1960-03-07  Dear Arbutus Ped,  I spoke with the patient, and we cancelled his surgery today.  We found out today that he did not want his node biopsy done or Port-A-Cath inserted.  He questioned whether he should have a Pleurx catheter and I told him since he did not come to get his preadmission, pre-surgery chest x-ray that I could not tell whether he needed the Pleurx or not. I suggested that he come back to my office next Monday with a chest x- ray and we could discuss whether it would be beneficial for the Pleurx or not.  He apparently does not want any chemotherapy or any further therapy.  Our office called the patient and he refused a followup appointment. But says he would call back if he changed his mind.  I appreciate the opportunity of seeing the patient, and this is a very sad case and I wish he would accept  therapy.  Ines Bloomer, M.D. Electronically Signed  DPB/MEDQ  D:  04/16/2010  T:  04/17/2010  Job:  [id number]

## 2010-06-11 DEATH — deceased

## 2010-06-25 NOTE — Assessment & Plan Note (Signed)
[facility]                            CARDIOLOGY OFFICE NOTE   [name], HARDENBROOK                   MRN:          [id number]  DATE:07/17/2006                            DOB:          04-Jun-1960    PRIMARY CARE PHYSICIAN:  Dr. Lorelei Pont.   REASON FOR PRESENTATION:  Evaluate patient with coronary disease.   HISTORY OF PRESENT ILLNESS:  The patient is a 50 year old gentleman who  was seen by another cardiology group in [place].  He has a history of  coronary disease dating back to 2005 when he had an anterior myocardial  infarction and stenting of his LAD.  He says he had a catheterization in  2006 but I do not have the details of this.  He has not had any new  symptoms.  He has chronic lung disease and is on chronic O2.  However,  his dyspnea is at baseline.  In the Spring of this year he did take a  nitroglycerin.  It was suggested that he have another stress test and he  got into an argument with his cardiologist and decided to switch care.  Since that time he has not had any further chest discomfort and has not  taken any sublingual nitroglycerin.  He is limited by back pain.  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  With his daily  activities he denies any chest pressure, neck or arm discomfort.  He has  got no palpitations, presyncope, or syncope.  He has had no PND or  orthopnea.  He has had some chronic lower extremity swelling.   He was hypotensive in the Spring as well.  He had his Altace  discontinued and his blood pressure has been normal since then.   PAST MEDICAL HISTORY:  1. Coronary artery disease as described.  2. Mildly reduced ejection fraction (Echocardiogram in February 2007      in [place] with an EF of 50%.  He had 2+ mitral regurgitation      and tricuspid regurgitation).  3. Hypertension since 1999.  4. Hyperlipidemia since 1999.  5. Rheumatoid arthritis.  6. Diabetes mellitus since 2002.  7. Restless leg syndrome.  8. Sleep apnea on CPAP.   PAST SURGICAL HISTORY:  1. Back surgery.  2. Hemorrhoids.   ALLERGIES:  NONE.   CURRENT MEDICATIONS:  1. Aspirin 81 mg daily.  2. Vytorin 10/40 daily.  3. Avandia 2 mg b.i.d.  4. Plavix 75 mg daily.  5. Prednisone 5 mg p.r.n.  6. Potassium.  7. Qualaquin 324 mg daily.  8. Xanax.  9. Ambien.  10.Prevacid.  11.Lasix 40 mg b.i.d.  12.Folic acid.  13.Celebrex 200 mg b.i.d.  14.Coreg 12.5 mg b.i.d.  15.Proventil.  16.Spiriva.  17.Methadone.  18.Percocet.  19.Parafon Forte.  20.Nitroglycerin.  21.Methotrexate 2.5 mg weekly.  22.Starlix 120 mg b.i.d.   SOCIAL HISTORY:  The patient is disabled.  He is married.  He has 3  children.  He is smoking 1/2 a pack a day and has done so for 25 years.   FAMILY HISTORY:  Contributory for his father  dying of a myocardial  infarction at 74.   REVIEW OF SYSTEMS:  As stated in the HPI, positive for occasional  headaches and dizziness, reflux, leg pains, leg swelling, constipation.  Negative for other systems.   PHYSICAL EXAMINATION:  The patient is in no acute distress.  Blood  pressure 122/74, heart rate 69 and regular, weight 299 pounds, body mass  index 48.  HEENT:  Eyelids unremarkable, pupils equally round and reactive to  light, fundi not visualized secondary to lens opacification, oral mucosa  unremarkable.  NECK:  No jugular venous distention at 45 degrees, carotid upstroke  brisk and symmetrical, no bruits, no thyromegaly.  LYMPHATICS:  No cervical, axillary, or inguinal adenopathy.  LUNGS:  Clear to auscultation bilaterally.  BACK:  No costovertebral angle tenderness.  CHEST:  Unremarkable.  HEART:  PMI not displaced or sustained, S1 and S2 within normal limits,  no S3, no S4, no clicks, no rubs, no murmurs.  ABDOMEN:  Obese, positive bowel sounds normal in frequency and pitch, no  bruits, no rebound, no guarding, no midline pulsatile mass, no obvious  hepatomegaly or  splenomegaly.  SKIN:  No rashes, no nodules.  EXTREMITIES:  With 2+ pulses throughout, mild bilateral lower extremity  edema, no cyanosis, no clubbing.  NEURO:  Oriented to person, place, and time; cranial nerves II-XII  grossly intact, motor grossly intact.   EKG sinus rhythm, rate 46, axis within normal limits, intervals within  normal limits, poor anterior R wave progression, anteroseptal infarct.   ASSESSMENT/PLAN:  1. Coronary disease.  The patient has not had any new symptoms since      his last stress test in 2006.  At this point I would not pursue      testing unless he has increased symptoms.  He will let me know      about this going forward.  We will continue secondary risk      reduction.  2. Mitral regurgitation.  The patient probably needs another      echocardiogram and I will do this in February of next year.  It      will be a year since the one he had in [place].  This will      also allow me to look at his mildly reduced ejection fraction.  He      will let me know if he has any dramatic change in his breathing, at      which point I would perform this sooner.  3. Risk reduction.  I will defer management of his lipids to Dr.      Leary Roca.  The goal is an LDL less than 70 and an HDL in the 40s      given his diabetes.  4. Morbid obesity.  It is going to be very difficult to lose weight      with his medications, immobility.  However, we discussed possibly      the [facility] Diet.  5. Tobacco.  We did counsel him about the need to stop smoking.  He      was given a prescription for Chantix by Dr. Craige Cotta.  I have      encouraged him to continue this.  6. Followup.  I will see him back in February of next year, sooner if      needed.     Rollene Rotunda, MD, [facility]  Electronically Signed    JH/MedQ  DD: 07/17/2006  DT: 07/17/2006  Job #: [id number]  cc:   Lorelei Pont, MD

## 2010-06-25 NOTE — Assessment & Plan Note (Signed)
[facility]                            CARDIOLOGY OFFICE NOTE   JEREMYAH, JELLEY                   MRN:          [id number]  DATE:05/18/2007                            DOB:          01-Oct-1960    PRIMARY CARE PHYSICIAN:  Dr. Lorelei Pont.   REASON FOR PRESENTATION:  Evaluate patient with coronary disease.   HISTORY OF PRESENT ILLNESS:  The patient is now 50 years old.  He  presents for follow-up.  He at the last visit was evaluated for mitral  regurgitation.  He had an echo in 2007.  It had demonstrated 2+ mitral  regurgitation.  He was not having any symptoms.  The plan was to see him  back in follow-up and then determine timing of repeat echocardiogram.   He remains on chronic O2 for his severe lung disease.  He has had no  change in this dyspnea.  He gets dyspneic with mild exertion.  He is not  describing PND or orthopnea.  He has had rare chest discomfort.  He has  taken two nitroglycerin in the last 9 or 10 months.  He does have some  lightheadedness.  This has been a chronic problem.  He has had no  presyncope or syncope.  He has had orthostatic of blood pressures which  were not abnormal today.   His most pressing complaint is nosebleeds.  He gets these daily.  This  has been going on for 3-4 weeks.  It flows freely and he has a difficult  time stopping it.  He saw his pulmonologist who suggested he might need  follow-up with ENT and he is going to arrange this in [place].   PAST MEDICAL HISTORY:  Coronary artery disease (anterior myocardial  infarction with stenting to his LAD, I do not have the details of his  most recent catheterization which apparently in 2006), mildly reduced  ejection fraction (echo in February 2007 in [place] with an EF of  50%.  2+ mitral regurgitation and tricuspid regurgitation), hypertension  since 1999, hyperlipidemia since 1999, rheumatoid arthritis, diabetes  mellitus since 2002, restless  leg syndrome, sleep apnea on CPAP, back  surgery, hemorrhoids.   ALLERGIES:  None.   MEDICATIONS:  1. Parafon Forte.  2. Percocet.  3. Methadone.  4. Vytorin 10/40.  5. Spiriva.  6. Starlix 120 mg b.i.d.  7. Methotrexate 2.5 mg a week.  8. Coreg 12.5 mg b.i.d.  9. Celebrex 2 mg b.i.d.  10.Folic acid.  11.Lasix 40 mg b.i.d.  12.Prevacid 30 mg daily.  13.Ambien 10 mg q.h.s.  14.Xanax.  15.Qualaquin 324 mg daily.  16.Potassium 20 mEq daily.  17.Plavix 75 mg daily.  18.Avandia 2 mg b.i.d.  19.Aspirin 81 mg daily.   REVIEW OF SYSTEMS:  As stated in the HPI and negative for other systems.   PHYSICAL EXAMINATION:  The patient is in no acute distress.  Blood  pressure 107/71, heart rate 66 and regular, no orthostatic blood  pressure drop.  HEENT:  Eyelids unremarkable, pupils equal, round and reactive to light,  fundi not visualized, oral mucosa unremarkable.  NECK:  No  jugular venous distention at 45 degrees though it is difficult  to examine with his very thick neck, carotid upstroke brisk and  symmetric.  No bruits, no thyromegaly.  LYMPHATICS:  No obvious cervical, axillary or inguinal adenopathy.  LUNGS:  Decreased breath sounds but no wheezing or crackles.  BACK:  No costovertebral angle tenderness.  CHEST:  Unremarkable.  HEART:  PMI not displaced or sustained, S1 and S2 within normal limits.  No S3, no S4, no clicks, no rubs, no murmurs.  ABDOMEN:  Obese, positive bowel sounds, normal in frequency and pitch,  no bruits, no rebound, no guarding, no midline pulsatile mass, no  organomegaly.  SKIN:  No rashes, no nodules.  EXTREMITIES:  2+ pulses throughout, mild bilateral lower extremity  edema, no cyanosis, no clubbing.  NEURO:  Grossly intact.   EKG:  Sinus rhythm, rate 66, axis within normal limits, interval is  within normal limits, poor anterior R wave progression with old  anteroseptal myocardial infarction, no acute ST-T wave changes.   ASSESSMENT/PLAN:  1.  Coronary disease.  The patient has had no new symptoms.  He rarely      takes nitroglycerin for chest pain.  At this point, I do not think      further cardiovascular testing is suggested.  He should continue      with risk reduction.  2. Mitral regurgitation. It has been a couple of years since his last      ultrasound.  I will arrange to have this done.  It would be most      convenient to have it done in [place] and the patient will make      sure that the results get to me.  3. Nose bleed. I have suggested that he follow-up with ENT and he will      make this arrangement.  4. Tobacco.  He is counseled about the need to stop smoking.  5. Risk reduction.  I will defer management of his lipids to Dr.      Leary Roca. It will be an LDL of less than 70 and HDL greater than 40.  6. Follow-up. I will see him back in 1 year or sooner if he has any      problems or needs sooner follow-up with his echocardiogram.     Rollene Rotunda, MD, [facility]  Electronically Signed    JH/MedQ  DD: 05/18/2007  DT: 05/19/2007  Job #: [id number]   cc:   Dr. Leary Roca

## 2010-06-25 NOTE — Assessment & Plan Note (Signed)
[facility]                             PULMONARY OFFICE NOTE   BRAIN, HONEYCUTT                   MRN:          [id number]  DATE:07/15/2006                            DOB:          1960-02-16    DATE OF BIRTH:  04-30-1960.   HISTORY:  I saw Mr. Dilley today in followup for his dyspnea, moderate  to severe chronic obstructive pulmonary disease, tobacco abuse,  obstructive sleep apnea, nasoseptal deviation, morbid obesity, restless  leg syndrome, insomnia, rheumatoid arthritis and hypoxemia.  He says  that he had stopped using his Spiriva approximately two to three weeks  ago, because he was developing a burning sensation in his lungs whenever  he would use it.  He says that since he stopped using his Spiriva he has  not noticed any worsening of his breathing and he continues to use his  Ventolin inhaler once or twice a day, which does seem to help.  He says  that he did not keep his appointment with Dr. Antony Contras of ENT,  because his car had broken down and he did not have transportation to  make the appointment, but he says that he will reschedule that.  He is  also due to see Dr. Rollene Rotunda for cardiology, later this week.  With regards to his sleep apnea, he is having a feeling like he is  getting a lot of congestion in his nose, which is making it difficult  for him to tolerate the CPAP machine, and he is wondering whether he may  be able to have a lower pressure setting.   CURRENT MEDICATIONS:  1. Ambien p.r.n.  2. Methadone 10 mg t.i.d.  3. Percocet 325/5 mg q.i.d.  4. Avandia 2 mg b.i.d.  5. Coreg 12.5 mg b.i.d.  6. Vytorin 10/40 mg q.d.  7. Plavix 75 mg q.d.  8. Xanax 0.5 mg b.i.d.  9. K-Dur 20 mEq q.d.  10.Lasix 40 mg b.i.d.  11.Aspirin 81 mg q.d.  12.Folic acid.  13.Methotrexate.  14.Qualaquin 325 mg q.d.  15.Ventolin inhaler p.r.n.  16.Prevacid 30 mg q.d.   PHYSICAL EXAMINATION:  VITAL SIGNS:  He weighs  301 pounds, temperature  97.6 degrees, blood pressure 108/50, heart rate 68, oxygen saturation  96% on 2 liters.  HEENT:  No sinus tenderness.  No oral lesions.  HEART:  S1 and S2.  LUNGS:  No wheezing.  ABDOMEN:  Obese, soft, nontender.  EXTREMITIES:  No edema.   IMPRESSION/PLAN:  1. Dyspnea with moderate to severe chronic obstructive pulmonary      disease:  I will continue him on his Ventolin p.r.n. and have him      discontinue his Spiriva.  If he does notice any worsening of his      symptoms, then I would consider adding an alternative      anticholinergic inhaler, such as Atrovent.  2. Hypoxemia:  I will continue him on his supplemental oxygen at 2      liters.  3. Tobacco abuse:  I will start him on Chantix.  I have explained to  him the dosing regimen.  I have also reviewed the possible adverse      side effects related to Chantix.  4. Obstructive sleep apnea:  I will have him undergo an auto CPAP      titration study for two weeks, to determine if he does need any      adjustments to his pressure setting.  5. Nasoseptal deviation:  He is to call Dr. Jenne Pane' office, to      reschedule his appointment.  Again, the main concern that I have is      if there are any surgical options to improve his nasoseptal      deviation, so that he will be able to tolerate his CPAP therapy      better.  6. Morbid obesity:  I again have discussed with him dietary      modifications.  7. Restless leg syndrome:  This is stable.  8. Insomnia:  This is stable.  9. Rheumatoid arthritis:  He is to continue on methotrexate and follow      up with his rheumatologist.  He will need to have follow-up      pulmonary function tests done while he is on methotrexate.   FOLLOWUP:  I will follow up with him in approximately six weeks.     Coralyn Helling, MD  Electronically Signed    VS/MedQ  DD: 07/15/2006  DT: 07/15/2006  Job #: [id number]   cc:   Lorelei Pont, M.D.

## 2010-06-28 NOTE — Assessment & Plan Note (Signed)
[facility]                               PULMONARY OFFICE NOTE   NAME:DONOVANTJenner, Rosier                   MRN:          [id number]  DATE:08/26/2005                            DOB:          [birth year]    I received the download for Mr. Clausing from his CPAP machine.  He still  has an elevation in his apnea/hypopnea index in spite of being on CPAP  pressure setting of 8.  Additionally, he has been having complaint of  feeling like he is not getting enough pressure.  Therefore, I will  empirically increase his pressure to 10 and then monitor his symptom  response and then follow up with him in the office.                                   Coralyn Helling, MD   VS/MedQ  DD:  08/26/2005  DT:  08/26/2005  Job #:  [id number]

## 2010-06-28 NOTE — Assessment & Plan Note (Signed)
[facility]                                 ON-CALL NOTE   [name], [name]                   MRN:          [id number]  DATE:06/27/2006                            DOB:          1960/06/08    The patient was followed by Dr. Coralyn Helling after being treated with  acute exacerbation of chronic obstructive pulmonary disease and was sent  home with prescription for Proventil.  Unfortunately, the physician  forgot to put the name Proventil on the prescription and therefore the  prescription was called in to [phone] via Dr. Shan Levans for  one Proventil, 2 puffs q.i.d. with three refills.      Devra Dopp, MSN, ACNP  Electronically Signed      Coralyn Helling, MD  Electronically Signed   SM/MedQ  DD: 06/27/2006  DT: 06/27/2006  Job #: [id number]

## 2010-06-28 NOTE — Discharge Summary (Signed)
NAME:  [name], [name]                      ACCOUNT NO.:  [id number]   MEDICAL RECORD NO.:  [id number]                   PATIENT TYPE:  INP   LOCATION:  2030                                 FACILITY:  [facility]   PHYSICIAN:  [name], M.D.          DATE OF BIRTH:  November 20, 1960   DATE OF ADMISSION:  04/17/2003  DATE OF DISCHARGE:  04/20/2003                                 DISCHARGE SUMMARY   DISCHARGE DIAGNOSES:  1. Coronary disease, anterior myocardial infarction this admission treated     with urgent left anterior descending artery Cypher stenting.  2. Left ventricular dysfunction with an ejection fraction of 35 to 40%.  3. Noninsulin-dependent diabetes.  4. Hyperlipidemia.  5. Chronic back pain.  6. History of smoking.  7. Obesity.   HOSPITAL COURSE:  The patient is a 50 year old transfer from [facility] with chest pain and EKG changes consistent with an anterolateral  myocardial infarction. He received Plavix 150 mg and two baby aspirin and IV  heparin in [place]. He was evaluated by Dr. Alanda Amass on admission to  [facility] and taken directly to the catheterization lab. Catheterization  revealed 40% RCA, 40% circumflex, 50 to 60% mid OM1, and a total proximal  LAD that was dilated and stented with a Cypher stent. EF was 35 to 40%. He  was enrolled in the [facility] registry. He was continued on Aggrenox for four  hours then put on aspirin and Plavix. He was transferred to CCU and did  well. He was transferred to the floor and ambulated. His medications were  adjusted prior to discharge. He does have problems with chronic back pain.  He had been on Bextra prior to admission, and we stopped this. The patient  states that Advil and Naprosyn have been ineffective. Decision regarding  resumption of Bextra will be made and an addendum dictated regarding this.  We feel he can be discharged April 20, 2003.   DISCHARGE MEDICATIONS:  1. Coated aspirin once a  day.  2. Plavix 75 mg a day.  3. Avandia 4 mg twice a day.  4. Parafon Forte 500 mg three times a day.  5. Prevacid 30 mg a day.  6. Wellbutrin SR 150 twice a day.  7. Altace 2.5 mg twice a day.  8. Coreg 6.25 twice a day.  9. Methadone 10 mg a day.  10.      Zocor 80 mg a day.  11.      Nitroglycerin sublingual p.r.n.   LABORATORY DATA:  Sodium 131, potassium 4.3, BUN 15, creatinine 0.8. White  count 9.8, hemoglobin 11.8, hematocrit 34.3, platelets 237. CK peaked at  5,216 with 351 MBs. Lipid profile shows a cholesterol of 244. Triglycerides  292, HDL 26, LDL 160. TSH 1.57. Chest x-ray shows cardiomegaly and pulmonary  vascular congestion and COPD. EKG reveals sinus rhythm.   DISPOSITION:  The patient is discharged in stable condition and will follow  up  with Dr. Alanda Amass April 4 at 10:40 a.m.      Abelino Derrick, P.A.                      [name], M.D.    Lenard Lance  D:  04/20/2003  T:  04/22/2003  Job:  [id number]   cc:   Ky Barban, M.D.  [place], [facility]   [facility]

## 2010-06-28 NOTE — Procedures (Signed)
NAME:  [name], [name]            ACCOUNT NO.:  [id number]   MEDICAL RECORD NO.:  [id number]          PATIENT TYPE:  OUT   LOCATION:  SLEEP CENTER                  FACILITY:  [facility]   PHYSICIAN:  Coralyn Helling, M.D.      DATE OF BIRTH:  September 20, 1960   DATE OF STUDY:  07/22/2005                              NOCTURNAL POLYSOMNOGRAM   REFERRING PHYSICIAN:  Coralyn Helling, M.D.   INDICATION FOR THE STUDY:  This is an individual who had undergone an  overnight polysomnogram on April1,2007, which showed apnea-hypopnea index of  6.9 with an oxygen saturation nadir of 70%.  Of note was that there was a  significant REM effect during the diagnostic study.  He returns to the sleep  lab for a CPAP titration study.  Epworth score is 7.   MEDICATIONS:  Coreg, Avandia, Plavix, Vytorin, Lasix, Klor-Con, Altace,  Restoril, Prevacid, Starlix, Mirapex, albuterol, Xanax, Percocet, and  methadone.   SLEEP ARCHITECTURE:  Total recording time was 295 minutes.  Total sleep time  was 334.5 minutes.  Sleep efficiency was 85%. The study was notable for a  lack of slow wave sleep and a slight reduction in the percentage of REM  sleep to 50%.  Sleep latency was 19.5 minutes which is slightly prolonged.  REM latency was 98 minutes which is normal.  The patient slept in both the  supine and non-supine positions.   RESPIRATORY DATA:  The the average respiratory rate was 14.  The patient was  titrated from a CPAP pressure setting of 5 to 8-cmH2O.  At a pressure  setting of 8-cmH2O the apnea-hypopnea index was reduced to 0.5 and  occasional snoring was heard but for the most part was eliminated.  The  patient was observed in both REM sleep and supine sleep at this pressure  setting.   OXYGEN DATA:  The baseline oxygenation was 93%.  The patient was initially  started on 1 liter of supplemental oxygen, but then was subsequently  increased to 2 liters.  The oxygen saturation nadir was 87%.  However, at  the pressure  setting of 8, the mean oxygen saturation during non-REM was  91.9 and during REM was 92.6.   CARDIAC DATA:  Average heart rate was 61 and EKG showed a normal sinus  rhythm.   MOVEMENT PARASOMNIA:  The periodic limb movement index was 6.6 which is  normal.   IMPRESSION:  This is a CPAP titration study.  At a pressure setting of 8-  cmH2O the apnea-hypopnea index was reduced to 0.5.  The patient was observed  in both supine sleep and REM sleep at this pressure setting.  Oxygenation  was stabilized with the addition of 2 liters of supplemental oxygen.  Of  note, is that a Respironics gel mask was used which was small/medium size.      Coralyn Helling, M.D.  Diplomat, Biomedical engineer of Sleep Medicine  Electronically Signed     VS/MEDQ  D:  07/29/2005 11:51:19  T:  07/29/2005 15:59:24  Job:  [id number]

## 2010-06-28 NOTE — Assessment & Plan Note (Signed)
[facility]                               PULMONARY OFFICE NOTE   NAME:DONOVANTKentaro, Huber                   MRN:          [id number]  DATE:12/01/2005                            DOB:          1960-04-30    I saw [name] today in follow-up for his moderate-severe COPD,  obstructive sleep apnea, and restless leg syndrome. Since his last visit, he  had been evaluated by a rheumatologist at [facility] and was started on  prednisone and methotrexate as a result of this his arthritis symptoms have  improved significantly. With regards to his COPD, he said his breathing is  reasonably stable on his current regimen of Spiriva one puff q.d., Advair  250/50 one puff b.i.d. and Ventolin HFA as needed. He continues to use  oxygen 2 liters nasal canula continuously. With regards to his sleep apnea,  he is currently on CPAP of 10 cm of water. His main difficulty currently is  that when he tries to use the CPAP machine, he is able to fall asleep with  it initially, but then he gets significant nasal congestion and as a result  has difficulty wearing the mask for the remainder of the night. Otherwise,  when he is using the machine, he is not snoring and he has noticed that he  feels slightly better in the morning.   PHYSICAL EXAMINATION:  He is 296 pounds. Temperature: 97.7. Blood pressure:  120/70. Heart rate: 69. Oxygen saturation is 98% on 2 liters. He continues  to smoke cigarettes, but states that he is trying to quit.  HEENT: There is no sinus tenderness. He has boggy nasal mucosa. There are no  oral lesions. No lymphadenopathy.  HEART: Was S1, S2.  CHEST: Clear to auscultation.  ABDOMEN: Obese, soft and nontender.  EXTREMITIES: There was no edema.   IMPRESSION:  1. Moderate-severe chronic obstructive pulmonary disease in addition to      having a mild reticular defect: I again will continue him on his      inhaler regimen of Spiriva, Advair and  Ventolin as well as his      supplemental oxygen.  2. Continued tobacco abuse: I have again encouraged him to continue his      smoking cessation program. He is quite reluctant to use any      pharmaceutical aids with regards to his smoking cessation at this time.  3. Obstructive sleep apnea:  I will continue him on CPAP at 10-cm of      water. I have given him a sample of Nasonex and have advised him to try      using nasal strips as well as nasal irrigation to see if this helps      improve his tolerance of his CPAP mask.  4. Insomnia: I would not make any adjustments in his regimen at this time.  5. Restless leg syndrome: This seems to be relatively stable at the      present time.  6. I have given him an influenza vaccination in our office today.   PLAN:  The plan is to  follow up with him in three months.       Coralyn Helling, MD      VS/MedQ  DD:  12/02/2005  DT:  12/03/2005  Job #:  [id number]

## 2010-06-28 NOTE — Cardiovascular Report (Signed)
NAME:  [name], [name]                      ACCOUNT NO.:  [id number]   MEDICAL RECORD NO.:  [id number]                   PATIENT TYPE:  OIB   LOCATION:  2920                                 FACILITY:  [facility]   PHYSICIAN:  [name], M.D.          DATE OF BIRTH:  [birth year]   DATE OF PROCEDURE:  04/17/2003  DATE OF DISCHARGE:                              CARDIAC CATHETERIZATION   PROCEDURE:  Retrograde central aortic catheterization, selective coronary  angiography, pre and post IC nitroglycerin administration, LV angiogram RAO,  LAO projection, subselective LIMA, abdominal angiogram PA projection, BIAMI  study protocol - registry, Angiomax infusion, p.o. Plavix 600 mg total,  aspirin, emergency guide wire recannulization PTA and subsequent DES  stenting total occluded LAD, side branch diagonal percutaneous transluminal  coronary angioplasty through stent.   BRIEF HISTORY:  Mr. Jolley is a divorced, remarried father of two with one  stepchild, no grandchildren.  Chronic smoking abuse of greater than two  packs, AODM diagnosis four to six years ago, positive family history father  dying at 85 of an myocardial infarction, remote back surgery in 1993 [facility] multi-level laminectomy with fusion and plates, chronic disability  on chronic narcotic therapy and methadone, hyperlipidemia, systemic  hypertension, exogenous obesity.   No prior history of angina.  At 10 a.m. at rest at home (sedentary  lifestyle) he began having severe substernal progressive chest pain  radiating to the neck and bilateral arms associated with nausea and sweat  without emesis.  His wife drove him to [facility].  EKG showed  acute AWMI.  He was given Plavix 150 mg, 5000 units of heparin, two baby  aspirin (takes two BCs a day at home) and transferred to our hospital.  His  pain was relieved while he was in the ambulance on medical therapy.  He was  brought immediately to the  second floor CP lab.  Appropriate history and  physical examination were performed showing no arrhythmias or vascular  abnormalities or bruits and no overt congestive heart failure or murmurs.   DESCRIPTION OF PROCEDURE:  The right groin was prepped, draped in the usual  manner.  1% Xylocaine was used for local anesthesia.  Informed consent was  obtained from the patient to proceed with catheterization, emergency  intervention and the patient agreed to participate in the Biami research  study registry (bivalirudin in management of patients with ST elevation,  acute myocardial infarction undergoing primary PCI).   A 6 French short side arm sheath was inserted into the CRFA without  difficulty using modified Seldinger technique with a single anterior  puncture using an 18 thin-wall needle.  Diagnostic coronary angiography was  done with 6 French 4 cm taper preformed coronary and pigtail catheters.  Subselective LIMA revealed patent LIMA and 20% narrowing of the proximal  subclavian with good residual lumen, good antegrade left vertebral flow.  LV  angiogram was done in the RAO and LAO projection  at 25 mL, 14 mL per second;  20 mL, 12 mL per second with pullback pressure of the CA showing no gradient  across the aortic valve.  Abdominal aortic angiogram was done in the  midstream PA projection to 25 mL, 20 mL per second showing single normal  renal arteries bilaterally.  The patient tolerated the diagnostic procedure well with continued ST  elevation and mild chest discomfort.  He was given 5 mg of Lopressor IV, 450  mg of Plavix in the lab, 40 mg p.o. Pepcid.   Preoperative BUN and creatinine was 10/0.9.   PRESSURES:  1. LV:  151/0; LVEDP 20-24 mmHg.  2. CA:  151/84 mmHg.  3. There is no gradient across the aortic valve on catheter pullback.   Fluoroscopy did not reveal any significant coronary, intracardiac or  valvular calcification.   The main left coronary was normal and  large.   The left anterior descending artery was totally occluded beyond the first  septal perforator and just beyond the large first diagonal.  There was no  antegrade flow and no collaterals to the distal LAD.  The first diagonal had  about 30% narrowing with occlusion just beyond this at the junction of the  proximal third of the LAD.   There was a large proximal OM-1 branch that had 50-60% narrowing in the mid  portion and trifurcated distally.   The dominant circumflex had 40% narrowing in the proximal third with good  residual lumen followed by a moderate size OM-2 and OM-3 branch.  The PDA  and PLA arose from the distal circumflex and had no significant stenosis.   The right coronary was nondominant.   There was 40% narrowing and diffuse irregularity in the proximal third and  diffuse irregularity of two RV branches with no high grade stenosis.   LV angiogram showed hypo-akinesis from the mid anterior lateral wall to the  mid inferior wall with paradoxical apical bulge at the apex and similar  abnormality from the mid septum to the distal posterior wall.  There was  good contraction of the high posterior wall, high anterolateral and basilar  segment of the inferior wall.  Estimated EF was approximately 35-40  considering hypercontractility of the uninvolved segments.  There was no  significant MR present.   It was elected to proceed with intervention on the Biami protocol.  The  patient was started on Angiomax bolus plus infusion, ACTs were monitored.  The left coronary was intubated with a JL-4 6 [place] guiding catheter.  Initially, an Asahi soft wire was used.  This was unable to cross the LAD  and was positioned in the side branch diagonal.   We then used an Asahi light wire as a second guide wire (double wire  technique) and we were able to cross the totally occluded LAD with the wire  free in the distal LAD.  The LAD just beyond the diagonal was dilated with a 2.5/13  PowerSail balloon  at 5-17 and 6-21.  Antegrade flow was then restored to the LAD and mild side  branch DX1 compromise, but patent.   The LAD was then stented with a 3.0/18 Cypher stent positioned just proximal  to the DX1, deployed at 10-25 and post dilated at 16-40.  Full stent  deployment was present angiographically.   The stent beyond the DX1 was post dilated with a 3.0/9 Maverick at 12/40.   There was 70-80% compromise of the DX1 ostium.  The guide wire had been  pulled back prior to stent deployment and then it was recrossed with the  Asahi light guide wire through the stent struts.  Through the stent struts,  a 2.5/__________mm Maverick balloon was positioned and carefully dilated at  6-42 and 5-38.  The balloon was pulled back.  IC nitroglycerin was given  prior to the intervention and again post intervention 200 mcg.  The patient  was given 5 mg of Lopressor IV.   Blood pressure was 120-130.  There was sinus rhythm with no ectopy.  The  patient was pain free at this time and TIMI-3 flow was restored to the LAD.  The stent appeared well deployed.  There was no dissection.  The LAD was  reduced from 100 to 0%.  The diagonal was reduced from 70 to less than 30%  with excellent flow.   Plan to continue to Angiomax for four hours per protocol, continue aspirin  and Plavix.   Per protocol since procedure went well, uncomplicated with good restoration  of flow and no residual thrombus, we would not use provisional 2b3a  inhibitor (ReoPro).   CATHETERIZATION DIAGNOSES:  1. Acute anterior wall myocardial infarction treated with emergency     reperfusion percutaneous coronary intervention on the Biami protocol,     successful left anterior descending reperfusion with Cypher drug-eluting     stenting and side branch diagonal one POBA.  2. Left ventricular dysfunction, acute.  Ejection fraction 35-40%.  3. Adult onset diabetes mellitus.  4. Hyperlipidemia.  5. Hypertension,  normal renal arteries.  6. Exogenous obesity.  7. Remote back surgery as outlined above.  8. Chronic back pain syndrome on chronic narcotics and methadone therapy.                                               [name], M.D.    RAW/MEDQ  D:  04/17/2003  T:  04/18/2003  Job:  [id number]   cc:   Dr. Ky Barban

## 2010-06-28 NOTE — Procedures (Signed)
NAME:  [name], [name]            ACCOUNT NO.:  [id number]   MEDICAL RECORD NO.:  [id number]          PATIENT TYPE:  OUT   LOCATION:  SLEEP CENTER                  FACILITY:  [facility]   PHYSICIAN:  Coralyn Helling, M.D.      DATE OF BIRTH:  06/14/1960   DATE OF STUDY:  05/11/2005                              NOCTURNAL POLYSOMNOGRAM   INDICATIONS FOR THE STUDY:  The patient has loud snoring and excessive  daytime sleepiness.  Presents to the sleep lab for evaluation of sleep-  disordered breathing.  Epworth score is 13.   MEDICATIONS ARE:  Spiriva, albuterol, Xanax, Lantus, benzonatate and  fluticasone.  Of note is that the patient took a Xanax prior to undergoing  the study.   SLEEP ARCHITECTURE:  The total recording time was 388 minutes.  Total sleep  time was 339 minutes.  Sleep efficiency was 87%.  The study was notable for  lack of slow-wave sleep and a reduction in REM sleep to 6%.  Sleep onset was  15 minutes.  REM onset was 223 minutes, which was prolonged.  The patient  slept predominately in the non-supine position.   RESPIRATORY DATA:  The apnea-hypopnea index was 6.9.  The majority of these  events were obstructive in nature.  Of note is that the patient had a REM  apnea-hypopnea index of 56.8, but had only 19 minutes of REM sleep.  Also of  note is that only a airflow was used and not a pressure transducer and,  therefore, this may underestimate the severity of his respiratoryevents.   OXYGEN DATA:  The patient uses 2 liters of oxygen at home and this added  during the study.  His oxygen saturation nadir was 78%.  He spent a total of  368 minutes with an oxygen saturation between 91 and 100%, 7.5 minutes with  an oxygen saturation between 81 and 90%, and 0.2 minutes with an oxygen  saturation between 71 and 80%.  EKG showed normal sinus rhythm with an  average heart rate of 78.   Movement parasomnia:  The periodic limb movement index was 9.6.   IMPRESSION:  This study shows  evidence for mild obstructive sleep apnea as  demonstrated by an apnea-hypopnea index of 6.9 with an oxygen saturation  nadir of 78%.  Of note is that the patient slept in the non-supine position  for the most part and, therefore, the severity of his sleep apnea may be  somewhat underestimated.  Additionally, since a pressure transducer was not  used, the severity of his sleep apnea may be underestimated on this basis as  well.  There was a suggestion that his events were more severe during REM  sleep; however, he had a minimal amount of REM  sleep.  At this time, consideration should be given to further therapeutic  interventions, which could include CPAP therapy, oral appliance, or surgical  intervention.      Coralyn Helling, M.D.  Diplomat, Biomedical engineer of Sleep Medicine  Electronically Signed     VS/MEDQ  D:  05/29/2005 18:25:49  T:  05/30/2005 14:57:25  Job:  [id number]

## 2010-06-28 NOTE — Assessment & Plan Note (Signed)
[facility]                             PULMONARY OFFICE NOTE   NAME:[name], [name]                   MRN:          [id number]  DATE:04/14/2006                            DOB:          [birth year]    I saw [name] today in followup for his moderate to severe COPD,  tobacco abuse, obstructive sleep apnea, insomnia, restless leg syndrome  and morbid obesity.   He says that he was not able to tolerate the use of the Nasonex or nasal  irrigation, essentially because he did not feel like it made any  difference with his breathing through his nose.  As a result, he says  that he has had difficulty tolerating his CPAP machine and has not used  it for the last two months.  Essentially what would happen is that he  would be able to fall asleep initially with the CPAP machine, but then  after two or three hours when he would wake up to go to the bathroom, he  had severe congestion of his nose and as a result was negative about  using the CPAP machine again after that.  He says that with even with  his oxygen nasal prongs, he sometimes will have to put it in his mouth  because of nasal congestion.  He says that his breathing also seems to  be getting worse, to the point where he is able to do very little  activity without becoming short of breath.  He is not having much as far  as coughing or wheezing.  He is not having much with sputum production.  He does get swelling in his legs.  This seems to improve with his Lasix,  as well as when he keeps his legs elevated.  He continues to smoke 4-5  cigarettes a day.  He says he does this after he eats a meal and had  difficulty getting to the point to where he stopped smoking completely.  He also appeared to be quite angry with his multiple medical problems.   CURRENT MEDICATIONS:  1. Ambien.  2. Methadone 10 mg t.i.d.  3. Percocet 325/5 q.i.d.  4. Avandia 4 mg q.d.  5. Coreg 12.5 mg b.i.d.  6. Vytorin  10/40 mg q.d.  7. Plavix 75 mg q.d.  8. Spiriva 1 puff q.d.  9. Altace 2.5 mg b.i.d.  10.Xanax 8.5 mg b.i.d.  11.K-Dur 20 mEq. q.d.  12.Lasix 40 mg b.i.d.  13.Aspirin 81 mg q.d.  14.Folic acid.  15.Methotrexate 2.5 mg, 10 tablets per week.  16.Qualaquin 225 mg q.d.  17.Prevacid 30 mg q.d.  18.Albuterol as needed.   PHYSICAL EXAMINATION:  VITAL SIGNS:  He is 302 pounds, temperature is  97, blood pressure is 94/52, heart rate is 67, oxygen saturation 97% on  3 liters.  HEENT:  There is no sinus tenderness.  He has a septum deviation to the  right.  There is no oral lesion, no lymphadenopathy.  HEART:  S1, S2.  CHEST:  No wheezing or rales.  ABDOMEN:  Obese, soft, nontender.  EXTREMITIES:  Minimal ankle edema.  IMPRESSION:  1. Dyspnea.  This is likely multifactorial in nature, related to his      chronic obstructive pulmonary disease, continued tobacco abuse,      obesity, deconditioning, and his rheumatoid arthritis.  2. Moderate to severe COPD.  I will continue him on his regimen of      Spiriva, Ventolin and supplemental oxygen.  3. Tobacco abuse.  I have suggested that he could try using nicotine      lozenges to assist with smoking cessation.  Again, quite reluctant      to pursue pharmaceutical aids with his smoking cessation, due to      his other multiple medications that he is on.  4. Obstructive sleep apnea.  I will change him to nasal pillows and      see if this helps alleviate some of his nasal symptoms which are      inhibiting his tolerance with CPAP therapy.  5. Nasal septum deviation.  I will refer him to ear, nose and throat      for further evaluation of this, with the hopes that this will      improve his tolerance with CPAP therapy.  6. Morbid obesity.  I will refer him to a dietician for further      recommendations for this.  I have also discussed with him the      option of gastric bypass surgery, and he is to consider this.  7. Restless leg syndrome.   This seems to be reasonably stable at the      present time.  8. Insomnia.  This seems to be reasonably stable at the present time.  9. Rheumatoid arthritis.  He is to due to have followup with his      rheumatologist for this.   I will see him in followup in approximately 6 to 8 weeks.     Randy Helling, MD  Electronically Signed    VS/MedQ  DD: 04/14/2006  DT: 04/14/2006  Job #: [id number]   cc:   Lorelei Pont, M.D.

## 2010-06-28 NOTE — Assessment & Plan Note (Signed)
[facility]                               PULMONARY OFFICE NOTE   NAME:[name], [name]                   MRN:          [id number]  DATE:09/01/2005                            DOB:          [birth year]    I saw Mr. Mcelhinny today for followup of his severe COPD, obstructive sleep  apnea, insomnia, restless leg syndrome and for evaluation of a power  mobility chair.  He says that he is quite limited with his ability to walk  due to symptoms of dyspnea.  He is only able to walk about 15 to 20 feet  before getting short of breath at which time he has to stop and catch his  breath.  Additionally, if he is doing activities such as bathing or cooking  he gets dyspneic doing these activities as well.  He also requires the use  of a cane to help with ambulation.  He says that because of his dyspnea  trying to use a manual wheelchair would be too difficult for him.  I feel  that he does have the physical and mental abilities to use a power chair and  then due to difficulties with mobility from his underlying pain syndrome and  arthritis, a scooter may not be a good option for him.  Otherwise he says  that the inhalers seem to be helping him and he seems to have gained a  symptomatic benefit from the use of CPAP, however he is having worsening  symptoms of joint stiffness and swelling and as a result of this he has been  having difficulty putting his CPAP mask on and he has not been using it for  the last several days.  He says that he is due to have followup with his  rheumatologist later this week.  Otherwise he has not had any other  significant changes to his medications since his last visit.   PHYSICAL EXAMINATION:  VITAL SIGNS:  He is 282 pounds.  Temperature is 98.6.  Blood pressure is 110/62.  Heart rate is 90.  Oxygen saturation is 97% on 2  liters.  HEENT:  There is no sinus tenderness or discharge or lesions.  HEART:  S1, S2.  CHEST:  Decreased  breath sounds but no wheezing or rales.  ABDOMEN:  Obese, soft, non-tender.  EXTREMITIES:  He has stiffness in his metacarpal phalangeal joints as well  as wrist joints with tenderness on passive range of motion.  He also has  tenderness on palpation in his ankle and knee joints bilaterally.   IMPRESSION:  1.  Moderate to severe chronic obstructive pulmonary disease, in addition to      having a mild restrictive defect. I will continue him on his inhaled      regimen of Spiriva, Advair and Ventolin, and I would also continue him      on his supplement oxygen.  2.  Obstructive sleep apnea.  I have encouraged him to maintain his      compliance with CPAP therapy as he does seem to have gained a      symptomatic benefit  from this.  Of note is that he recently had his CPAP      pressure increased to 10.  3.  Insomnia.  He will try to taper off of the dose of Restoril.  It appears      that much of his difficulty with this is related to his pain symptoms.  4.  Restless leg syndrome.  This does not seem to be much of an issue at      this time and I would monitor him currently for this.  5.  Joint pain and swelling.  He is due to have followup with his      rheumatologist and then I will plan on seeing him back here in      approximately 3 months.                                   Coralyn Helling, MD   VS/Med[facility]  DD:  09/02/2005  DT:  09/02/2005  Job #:  [id number]

## 2012-08-09 IMAGING — CT NM PET TUM IMG INITIAL (PI) SKULL BASE T - THIGH
1 of 5 series · 2 of 25 positions shown · IV contrast ([ID])
Comparison: [facility] report [facility] 03/01/2010

***ADDENDUM*** CREATED: 03/21/2010 [DATE]

Findings conveyed to Dr. Maulik on 03/21/2010 at 0602 hours.
***END ADDENDUM*** SIGNED BY: Okiki Gowon, M.D.
CLINICAL DATA: Initial  treatment strategy for lung cancer .
NUCLEAR MEDICINE PET SKULL BASE TO THIGH
Fasting Blood Glucose:  88
TECHNIQUE: 15.5 mCi F-18 FDG was injected intravenously via the
right antecubital fossa.  Full-ring PET imaging was performed from
the skull base through the mid-thighs 68  minutes after injection.
CT data was obtained and used for attenuation correction and
anatomic localization only.  (This was not acquired as a diagnostic
CT examination.)

[Series 2: ct images · axial · 3.8mm · 0.98mm/px · z∈[-108,-10]mm · 2 of 265 slices shown]
[im 235/265  brain]
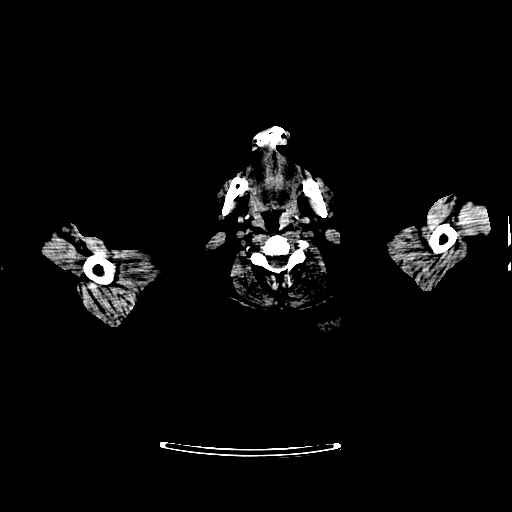
[im 265/265  brain]
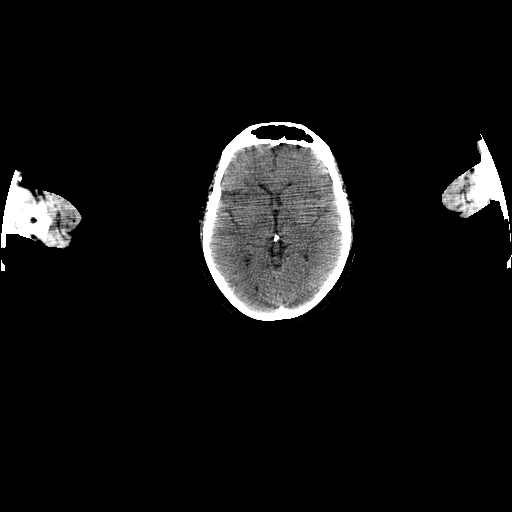

[2 of 25 positions shown; findings below may reference images not displayed]

FINDINGS: Neck:No hypermetabolic nodes in the neck. There is asymmetric
uptake within the right anterior larynx (image 43).  There is
misregistration through this region.  This may represent a
paralyzation of the left vocal cord secondary to involvement of the
left recurrent laryngeal nerve.  The  activity is more focal than
the typical linear pattern.  Cannot complete exclude laryngeal
lesion.  Recommend clinical correlation with coarseness.

Chest:There is a rind of hypermetabolic activity within the right
pleural space which correlates to nodular pleural thickening on
the CT portion.  This hypermetabolic activity is intense with SUV
max = 14.4 (image 106) for example.   The pleural metastasis
extends into the chest wall and  involves at least one posterior
rib on the right (image 122).

Within the right lower lobe there is a rounded hypermetabolic mass
with SUV max =23.6.  This mass measures 4.3  cm (image 84) and  may
represent primary lesion.

 There are intensely hypermetabolic right perihilar paratracheal
lymph nodes measuring up to 3.8 cm and SUV max = 21.3.
Hypermetabolic prevascular node on the contralateral side.
Hypermetabolic high paratracheal lymph nodes on the right.

Hypermetabolic focus within the left upper lobe with SUV max = 9.4.
This is correlated with a 3.1 cm ill-defined mass.

There is air-fluid  at the right lung apex which either represents
a hydropneumothorax or fluid within the bulla.

Abdomen / Pelvis:There is hypermetabolic nodule adjacent to the
diaphragmatic crus on the right just medial to the adrenal gland
which measures 18 mm (image 139) with SUV max = 14.4.
Hypermetabolic node within the gastrohepatic ligament (image 129).
No abnormal hypermetabolic activity within the liver.  Adrenal
glands are normal.

No more inferior hypermetabolic nodes evident.

Skeleton:No focal hypermetabolic activity to suggest skeletal
metastasis.
IMPRESSION: 1.  Intensely hypermetabolic 4.3 cm mass in the right lower lobe
may represent the primary bronchogenic carcinoma.
2.  Extensive pleural disease within the right pleural space with
intense hypermetabolic pleural metastasis throughout the entire
right pleural space.  There is  chest wall involvement and
posterior right lateral rib involvement.
3.  Extensive mediastinal adenopathy including right hilar,
paratracheal, and contralateral prevascular nodes.  The most
superior node is a high right paratracheal node.
4.  Hypermetabolic 3 cm ill-defined mass within the left upper lobe
may represent metastasis.
5.  Evidence for extension below the diaphragms with a
hypermetabolic gastrohepatic ligament node and hypermetabolic
nodule within the retroperitoneum in the right supra renal
location.
6.  Asymmetric activity within the right larynx.  This may
represent  paralyzation of the left vocal cord from involvement of
the left recurrent laryngeal nerve.  See above.  7.  Small
hydropneumothorax at the right  lung apex.

## 2012-08-15 IMAGING — CT CT BIOPSY
1 series · 15 of 33 positions shown, 19 images · non-contrast
Comparison: none

CLINICAL DATA: Metastatic right lung carcinoma with mediastinal
adenopathy, right pleural and chest wall involvement

[Series 3: lung bx · axial · 0.70mm/px · z∈[-309,-209]mm · 15 of 93 slices shown, 19 images]
[im 7/93  mediastinal]
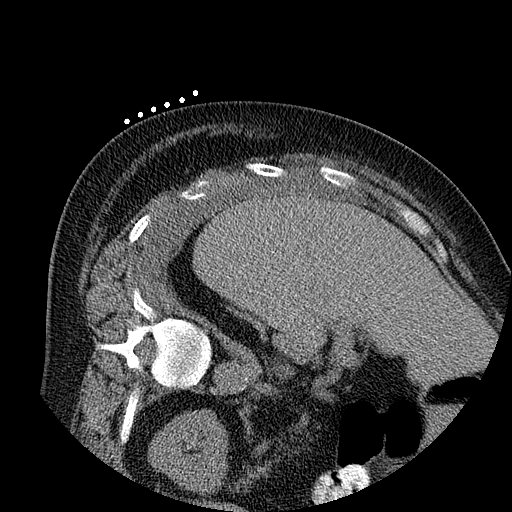
[im 7/93  lung]
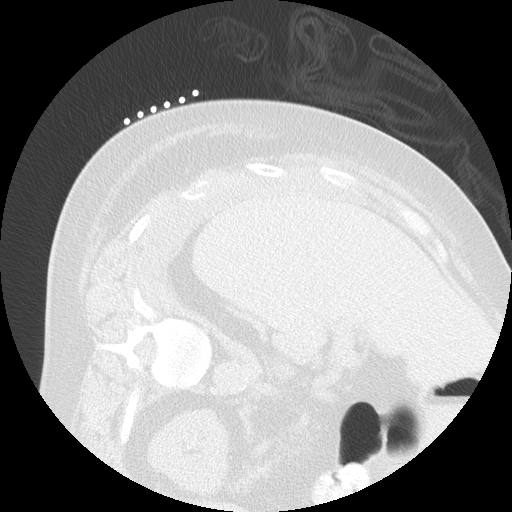
[im 14/93  lung]
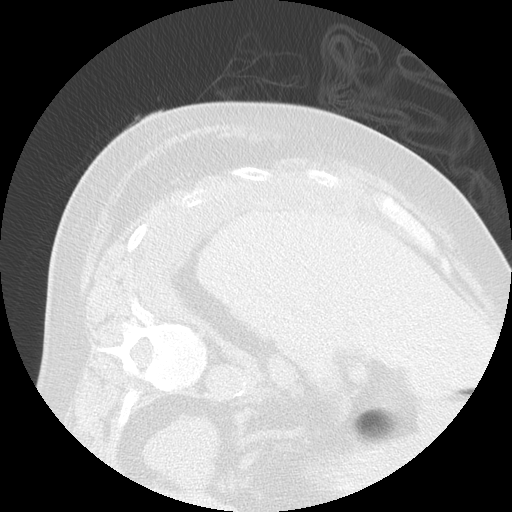
[im 19/93  lung]
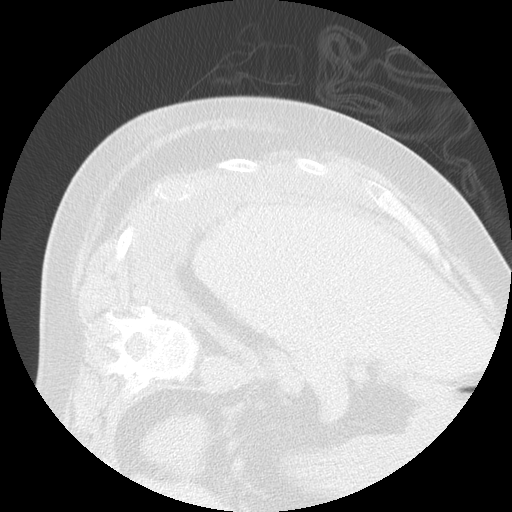
[im 24/93  lung]
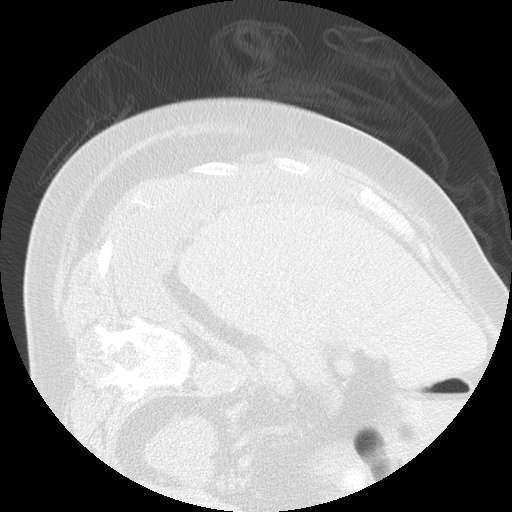
[im 31/93  mediastinal]
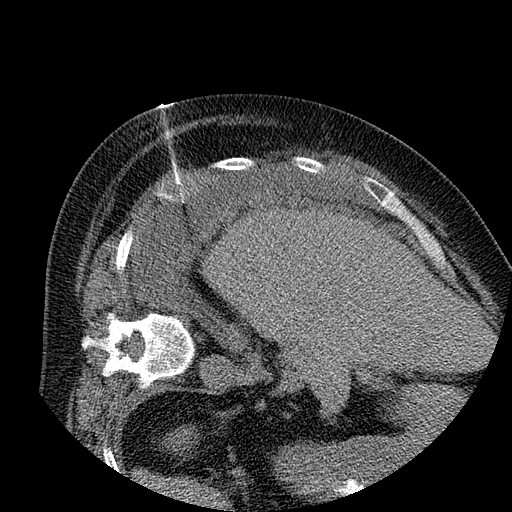
[im 31/93  lung]
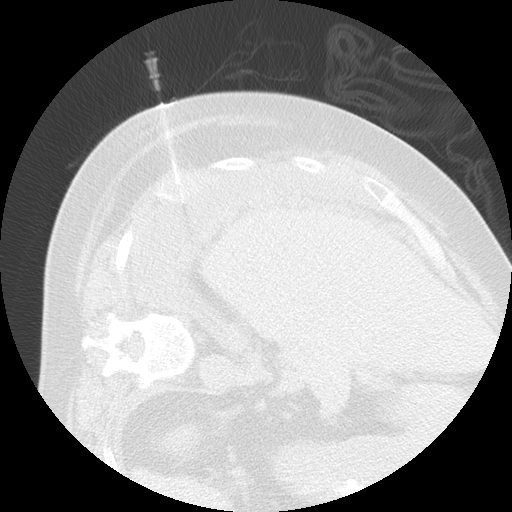
[im 37/93  lung]
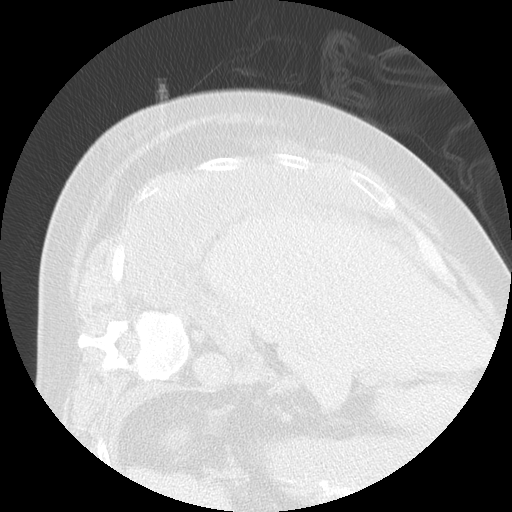
[im 41/93  lung]
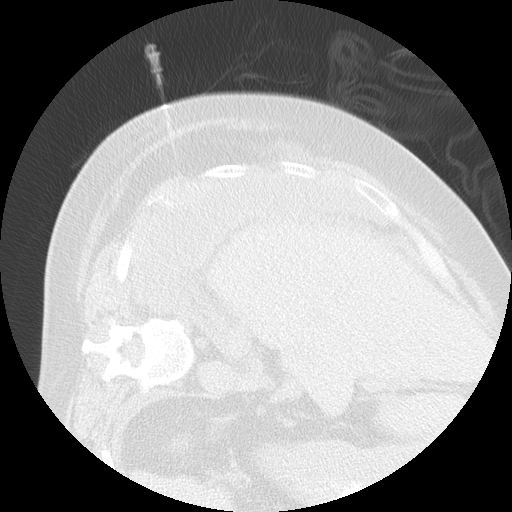
[im 48/93  lung]
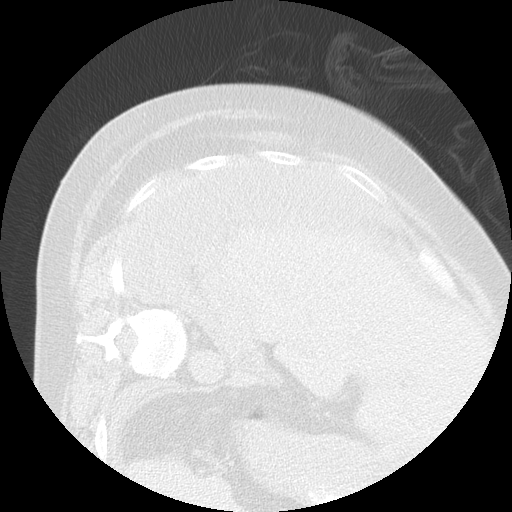
[im 55/93  mediastinal]
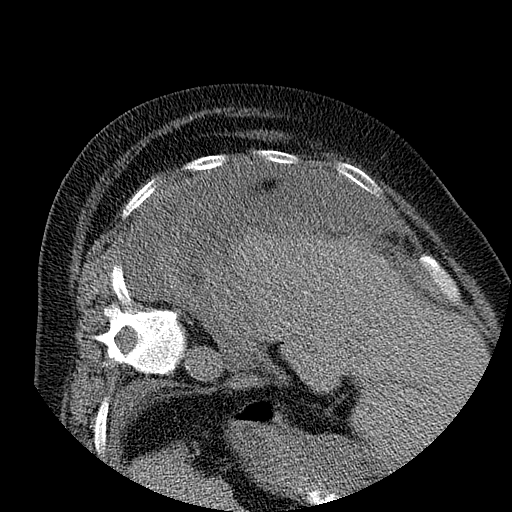
[im 55/93  lung]
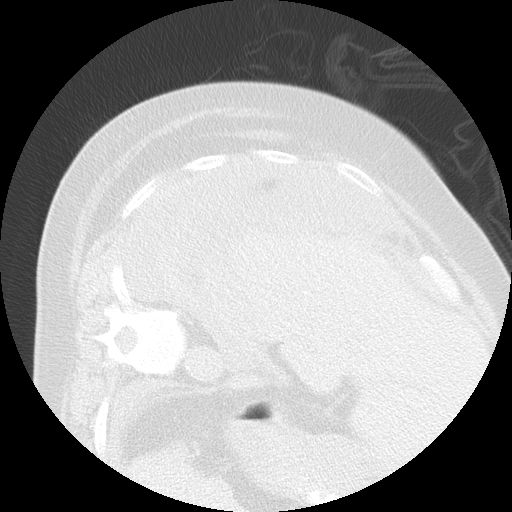
[im 58/93  lung]
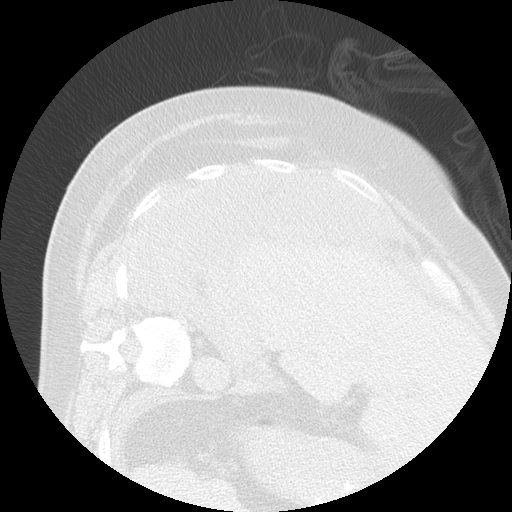
[im 65/93  lung]
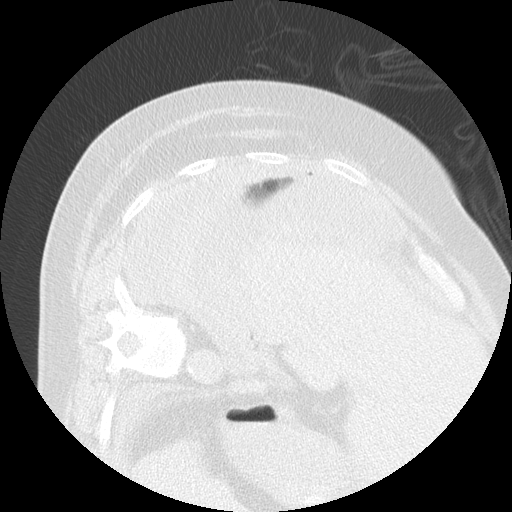
[im 72/93  lung]
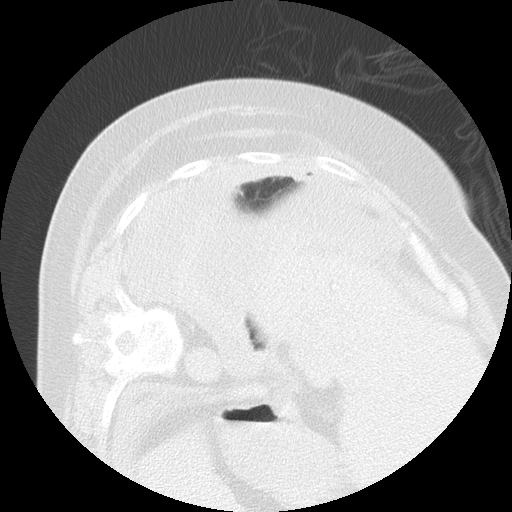
[im 75/93  mediastinal]
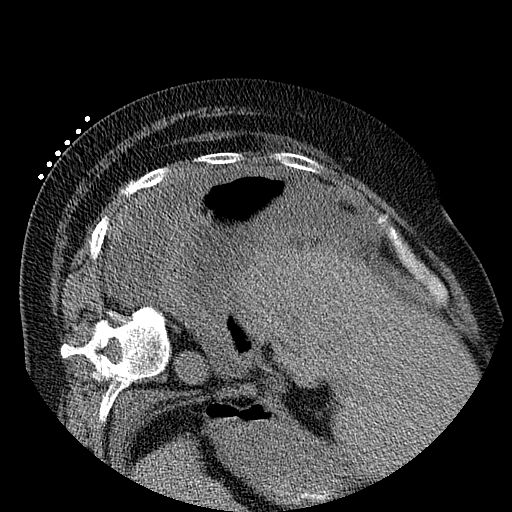
[im 75/93  lung]
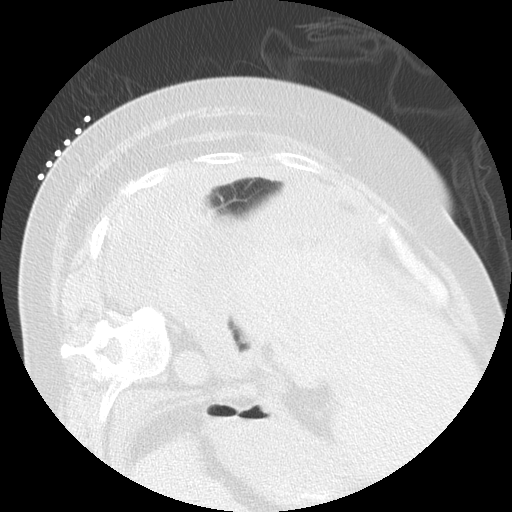
[im 79/93  lung]
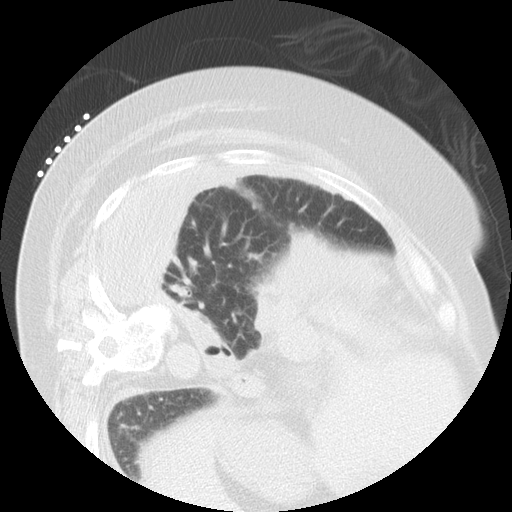
[im 86/93  lung]
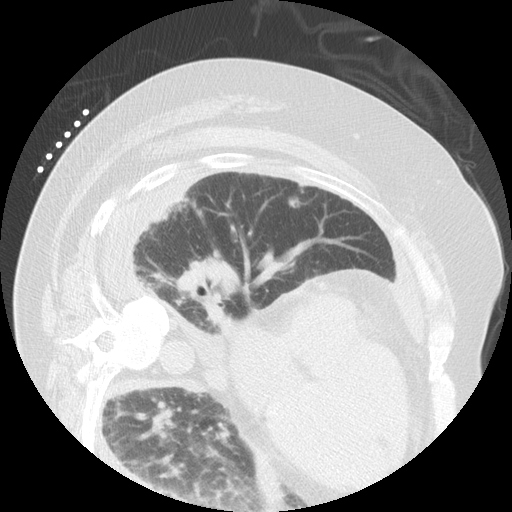

[15 of 33 positions shown; findings below may reference images not displayed]

CT GUIDED RIGHT TENTH RIB FNA AND CORE BIOPSY

Date:  03/27/2010 [DATE]

Radiologist:  Kareem Samuel, M.D.

Medications:  1 mg Versed, 50 mcg Fentanyl

Guidance:  CT

Fluoroscopy time:  None.

Sedation time:  0.5 minutes

Contrast volume:  None.

Complications:  No immediate

PROCEDURE/FINDINGS:

Informed consent was obtained from the patient following
explanation of the procedure, risks, benefits and alternatives.
The patient understands, agrees and consents for the procedure.
All questions were addressed.  A time out was performed.

Maximal barrier sterile technique utilized including caps, mask,
sterile gowns, sterile gloves, large sterile drape, hand hygiene,
and betadine

Previous PET CT was reviewed.  Destructive rib lesions noted at the
right ninth and tenth ribs.

The patient was positioned left decubitus.  Noncontrast
localization CT was performed.  The destructive lesion of the right
lateral tenth rib was localized.  Under sterile conditions and
local anesthesia, a 17 gauge 6.8 cm access needle was advanced into
the right tenth rib lesion.  Needle position confirmed with CT.
FNA biopsy performed three times with Alrasta Amola.  Additional
core biopsies were obtained with an 18 gauge core device.  Samples
reviewed and adequate in cellularity.  Malignant cells identified.
Final pathology pending.  The patient tolerated the biopsy well.
No immediate complication.
IMPRESSION: CT guided right tenth rib destructive lesion FNA and core biopsies.

## 2014-05-10 ENCOUNTER — Telehealth: Payer: Self-pay

## 2014-05-10 NOTE — Telephone Encounter (Signed)
05/10/14 Disc received from [facility] and filed on shelf.Britt Bottom
